# Patient Record
Sex: Female | Born: 1945 | Race: White | Hispanic: No | Smoking: Never smoker
Health system: Southern US, Community
[De-identification: ages and names within clinical notes are randomized; demographics above are authoritative.]

## PROBLEM LIST (undated history)

## (undated) DIAGNOSIS — I10 Essential (primary) hypertension: Secondary | ICD-10-CM

## (undated) HISTORY — PX: CHOLECYSTECTOMY: SHX55

---

## 2019-06-02 ENCOUNTER — Other Ambulatory Visit: Payer: Self-pay

## 2019-06-02 DIAGNOSIS — Z20822 Contact with and (suspected) exposure to covid-19: Secondary | ICD-10-CM

## 2019-06-03 ENCOUNTER — Telehealth: Payer: Self-pay | Admitting: *Deleted

## 2019-06-03 NOTE — Telephone Encounter (Signed)
Son (pt with him) called looking for covid-19 test result.  No result available.

## 2019-06-04 LAB — NOVEL CORONAVIRUS, NAA: SARS-CoV-2, NAA: NOT DETECTED

## 2019-06-07 ENCOUNTER — Inpatient Hospital Stay
Admission: EM | Admit: 2019-06-07 | Discharge: 2019-06-10 | DRG: 515 | Disposition: A | Discharge status: Home / Self Care | Payer: Medicare (Managed Care) | Attending: Internal Medicine | Admitting: Internal Medicine

## 2019-06-07 ENCOUNTER — Observation Stay: Payer: Medicare (Managed Care)

## 2019-06-07 ENCOUNTER — Encounter: Payer: Self-pay | Admitting: Emergency Medicine

## 2019-06-07 ENCOUNTER — Emergency Department: Payer: Medicare (Managed Care)

## 2019-06-07 ENCOUNTER — Other Ambulatory Visit: Payer: Self-pay

## 2019-06-07 DIAGNOSIS — M810 Age-related osteoporosis without current pathological fracture: Secondary | ICD-10-CM | POA: Diagnosis present

## 2019-06-07 DIAGNOSIS — F419 Anxiety disorder, unspecified: Secondary | ICD-10-CM | POA: Diagnosis present

## 2019-06-07 DIAGNOSIS — S22080A Wedge compression fracture of T11-T12 vertebra, initial encounter for closed fracture: Principal | ICD-10-CM | POA: Diagnosis present

## 2019-06-07 DIAGNOSIS — U071 COVID-19: Secondary | ICD-10-CM | POA: Diagnosis present

## 2019-06-07 DIAGNOSIS — T48205A Adverse effect of unspecified drugs acting on muscles, initial encounter: Secondary | ICD-10-CM | POA: Diagnosis present

## 2019-06-07 DIAGNOSIS — R55 Syncope and collapse: Secondary | ICD-10-CM

## 2019-06-07 DIAGNOSIS — Z7983 Long term (current) use of bisphosphonates: Secondary | ICD-10-CM

## 2019-06-07 DIAGNOSIS — Z419 Encounter for procedure for purposes other than remedying health state, unspecified: Secondary | ICD-10-CM

## 2019-06-07 DIAGNOSIS — M549 Dorsalgia, unspecified: Secondary | ICD-10-CM | POA: Diagnosis not present

## 2019-06-07 DIAGNOSIS — W19XXXA Unspecified fall, initial encounter: Secondary | ICD-10-CM | POA: Diagnosis present

## 2019-06-07 DIAGNOSIS — I10 Essential (primary) hypertension: Secondary | ICD-10-CM | POA: Diagnosis present

## 2019-06-07 DIAGNOSIS — I951 Orthostatic hypotension: Secondary | ICD-10-CM | POA: Diagnosis present

## 2019-06-07 DIAGNOSIS — T465X5A Adverse effect of other antihypertensive drugs, initial encounter: Secondary | ICD-10-CM | POA: Diagnosis present

## 2019-06-07 HISTORY — DX: Essential (primary) hypertension: I10

## 2019-06-07 LAB — TROPONIN I (HIGH SENSITIVITY)
Troponin I (High Sensitivity): 4 ng/L (ref <18)
Troponin I (High Sensitivity): 4 ng/L (ref <18)
Troponin I (High Sensitivity): 5 ng/L (ref <18)

## 2019-06-07 LAB — CBC WITH DIFFERENTIAL/PLATELET
Abs Immature Granulocytes: 0.03 10*3/uL (ref 0.00–0.07)
Basophils Absolute: 0 10*3/uL (ref 0.0–0.1)
Basophils Relative: 0 %
Eosinophils Absolute: 0 10*3/uL (ref 0.0–0.5)
Eosinophils Relative: 0 %
HCT: 41.5 % (ref 36.0–46.0)
Hemoglobin: 13.8 g/dL (ref 12.0–15.0)
Immature Granulocytes: 1 %
Lymphocytes Relative: 26 %
Lymphs Abs: 1 10*3/uL (ref 0.7–4.0)
MCH: 29.1 pg (ref 26.0–34.0)
MCHC: 33.3 g/dL (ref 30.0–36.0)
MCV: 87.4 fL (ref 80.0–100.0)
Monocytes Absolute: 0.3 10*3/uL (ref 0.1–1.0)
Monocytes Relative: 8 %
Neutro Abs: 2.5 10*3/uL (ref 1.7–7.7)
Neutrophils Relative %: 65 %
Platelets: 237 10*3/uL (ref 150–400)
RBC: 4.75 MIL/uL (ref 3.87–5.11)
RDW: 12.3 % (ref 11.5–15.5)
WBC: 3.9 10*3/uL — ABNORMAL LOW (ref 4.0–10.5)
nRBC: 0 % (ref 0.0–0.2)

## 2019-06-07 LAB — COMPREHENSIVE METABOLIC PANEL
ALT: 18 U/L (ref 0–44)
AST: 22 U/L (ref 15–41)
Albumin: 4.2 g/dL (ref 3.5–5.0)
Alkaline Phosphatase: 54 U/L (ref 38–126)
Anion gap: 12 (ref 5–15)
BUN: 10 mg/dL (ref 8–23)
CO2: 25 mmol/L (ref 22–32)
Calcium: 9.3 mg/dL (ref 8.9–10.3)
Chloride: 103 mmol/L (ref 98–111)
Creatinine, Ser: 0.63 mg/dL (ref 0.44–1.00)
GFR calc Af Amer: >60 mL/min (ref >60)
GFR calc non Af Amer: >60 mL/min (ref >60)
Glucose, Bld: 158 mg/dL — ABNORMAL HIGH (ref 70–99)
Potassium: 3.9 mmol/L (ref 3.5–5.1)
Sodium: 140 mmol/L (ref 135–145)
Total Bilirubin: 1 mg/dL (ref 0.3–1.2)
Total Protein: 6.9 g/dL (ref 6.5–8.1)

## 2019-06-07 LAB — GLUCOSE, CAPILLARY: Glucose-Capillary: 134 mg/dL — ABNORMAL HIGH (ref 70–99)

## 2019-06-07 LAB — SARS CORONAVIRUS 2 (TAT 6-24 HRS): SARS Coronavirus 2: POSITIVE — AB

## 2019-06-07 MED ORDER — PRAVASTATIN SODIUM 20 MG PO TABS
20.0000 mg | ORAL_TABLET | Freq: Every day | ORAL | Status: DC
  Administered 2019-06-07 – 2019-06-10 (×3): 20 mg via ORAL
  Filled 2019-06-07 (×3): qty 1

## 2019-06-07 MED ORDER — GABAPENTIN 300 MG PO CAPS
300.0000 mg | ORAL_CAPSULE | Freq: Every day | ORAL | Status: DC
  Administered 2019-06-10: 300 mg via ORAL
  Filled 2019-06-07 (×3): qty 1

## 2019-06-07 MED ORDER — MORPHINE SULFATE (PF) 2 MG/ML IV SOLN
2.0000 mg | Freq: Once | INTRAVENOUS | Status: AC
  Administered 2019-06-07: 14:00:00 2 mg via INTRAVENOUS
  Filled 2019-06-07: qty 1

## 2019-06-07 MED ORDER — SODIUM CHLORIDE 0.9 % IV SOLN
INTRAVENOUS | Status: AC
  Administered 2019-06-07 – 2019-06-08 (×3): via INTRAVENOUS

## 2019-06-07 MED ORDER — ONDANSETRON HCL 4 MG/2ML IJ SOLN: 4.0000 mg | Freq: Four times a day (QID) | INTRAMUSCULAR | Status: DC | PRN

## 2019-06-07 MED ORDER — TRAMADOL HCL 50 MG PO TABS
50.0000 mg | ORAL_TABLET | Freq: Four times a day (QID) | ORAL | Status: DC | PRN
  Administered 2019-06-08: 50 mg via ORAL
  Filled 2019-06-07: qty 1

## 2019-06-07 MED ORDER — ONDANSETRON HCL 4 MG/2ML IJ SOLN
4.0000 mg | Freq: Once | INTRAMUSCULAR | Status: AC
  Administered 2019-06-07: 09:00:00 4 mg via INTRAVENOUS
  Filled 2019-06-07: qty 2

## 2019-06-07 MED ORDER — ENOXAPARIN SODIUM 40 MG/0.4ML ~~LOC~~ SOLN
40.0000 mg | SUBCUTANEOUS | Status: DC
  Administered 2019-06-07: 40 mg via SUBCUTANEOUS
  Filled 2019-06-07: qty 0.4

## 2019-06-07 MED ORDER — ALENDRONATE SODIUM 70 MG PO TABS: 70.0000 mg | ORAL_TABLET | ORAL | Status: DC

## 2019-06-07 MED ORDER — ONDANSETRON HCL 4 MG/2ML IJ SOLN
4.0000 mg | Freq: Once | INTRAMUSCULAR | Status: AC
  Administered 2019-06-07: 14:00:00 4 mg via INTRAVENOUS
  Filled 2019-06-07: qty 2

## 2019-06-07 MED ORDER — CLONAZEPAM 0.5 MG PO TABS
0.5000 mg | ORAL_TABLET | Freq: Every day | ORAL | Status: DC | PRN
  Administered 2019-06-07 – 2019-06-08 (×2): 0.5 mg via ORAL
  Filled 2019-06-07 (×2): qty 1

## 2019-06-07 MED ORDER — SODIUM CHLORIDE 0.9 % IV BOLUS
1000.0000 mL | Freq: Once | INTRAVENOUS | Status: AC
  Administered 2019-06-07: 1000 mL via INTRAVENOUS

## 2019-06-07 MED ORDER — ONDANSETRON HCL 4 MG PO TABS: 4.0000 mg | ORAL_TABLET | Freq: Four times a day (QID) | ORAL | Status: DC | PRN

## 2019-06-07 MED ORDER — ACETAMINOPHEN 325 MG PO TABS
650.0000 mg | ORAL_TABLET | Freq: Four times a day (QID) | ORAL | Status: DC | PRN
  Administered 2019-06-08 (×2): 650 mg via ORAL
  Filled 2019-06-07 (×2): qty 2

## 2019-06-07 MED ORDER — ACETAMINOPHEN 650 MG RE SUPP
650.0000 mg | Freq: Four times a day (QID) | RECTAL | Status: DC | PRN
  Filled 2019-06-07: qty 1

## 2019-06-07 NOTE — ED Provider Notes (Addendum)
[facility] Emergency Department Provider Note   ____________________________________________   First MD Initiated Contact with Patient 06/07/19 [phone]     (approximate)  I have reviewed the triage vital signs and the nursing notes.   HISTORY  Chief Complaint Fall   HPI Danley DankerGeorgina Labella is a 73 y.o. female who is helping her husband who is positive for Covid.  Patient is tested negative.  She was helping him and then got hot and sweaty her vision started to go out and she saw stars.  She sat down briefly and then got up to get some Tylenol flu and passed out fell backwards and hit her head on the wall.  Daughter reports she was unconscious briefly when she was found.  Patient complains now only of some pain in the lower ribs bilaterally.        Past Medical History:  Diagnosis Date   Hypertension     There are no active problems to display for this patient.   Past Surgical History:  Procedure Laterality Date   CHOLECYSTECTOMY      Prior to Admission medications   Not on File    Allergies Patient has no known allergies.  History reviewed. No pertinent family history.  Social History Social History   Tobacco Use   Smoking status: Never Smoker   Smokeless tobacco: Never Used  Substance Use Topics   Alcohol use: Never    Frequency: Never   Drug use: Never    Review of Systems  Constitutional: No fever/chills Eyes: No visual changes. ENT: No sore throat. Cardiovascular: Denies chest pain. Respiratory: Denies shortness of breath. Gastrointestinal: No abdominal pain.  No nausea, no vomiting.  No diarrhea.  No constipation. Genitourinary: Negative for dysuria. Musculoskeletal: Negative for back pain. Skin: Negative for rash. Neurological: Negative for headaches, focal weakness    ____________________________________________   PHYSICAL EXAM:  VITAL SIGNS: ED Triage Vitals  Enc Vitals Group     BP 06/07/19 0633 (!)  151/54     Pulse Rate 06/07/19 0633 74     Resp 06/07/19 0633 12     Temp 06/07/19 0633 98.5 F (36.9 C)     Temp Source 06/07/19 0633 Oral     SpO2 06/07/19 0633 97 %     Weight 06/07/19 0634 149 lb (67.6 kg)     Height 06/07/19 0634 5\' 2"  (1.575 m)     Head Circumference --      Peak Flow --      Pain Score 06/07/19 0634 8     Pain Loc --      Pain Edu? --      Excl. in GC? --     Constitutional: Alert and oriented. Well appearing and in no acute distress. Eyes: Conjunctivae are normal.  Head: Atraumatic. Nose: No congestion/rhinnorhea. Mouth/Throat: Mucous membranes are moist.  Oropharynx non-erythematous. Neck: No stridor.Cardiovascular: Normal rate, regular rhythm. Grossly normal heart sounds.  Good peripheral circulation. Respiratory: Normal respiratory effort.  No retractions. Lungs CTAB. Gastrointestinal: Soft and nontender. No distention. No abdominal bruits. No CVA tenderness. Musculoskeletal: No lower extremity tenderness nor edema.  Patient denied any pain on palpation of her back initially later complains of back pain. Neurologic:  Normal speech and language. No gross focal neurologic deficits are appreciated.  Motor strength in arms and legs is equal and symmetrical bilaterally patient with no numbness reported in your extremities or elsewhere. Skin:  Skin is warm, dry and intact. No rash noted.  ____________________________________________   LABS (all labs ordered are listed, but only abnormal results are displayed)  Labs Reviewed  COMPREHENSIVE METABOLIC PANEL - Abnormal; Notable for the following components:      Result Value   Glucose, Bld 158 (*)    All other components within normal limits  CBC WITH DIFFERENTIAL/PLATELET - Abnormal; Notable for the following components:   WBC 3.9 (*)    All other components within normal limits  SARS CORONAVIRUS 2 (TAT 6-24 HRS)  URINALYSIS, COMPLETE (UACMP) WITH MICROSCOPIC  TROPONIN I (HIGH SENSITIVITY)  TROPONIN I  (HIGH SENSITIVITY)   ____________________________________________  EKG EKG read interpreted by me shows normal sinus rhythm rate of 83 left axis flipped T's in V1 through V3 no other findings.  Computer is reading borderline prolonged QT QTC is measured at 493 ms there are no old EKGs __EKG #2 read and interpreted by me is essentially unchanged normal sinus rhythm at 79 left axis again no changes from #1 __________________________________________  RADIOLOGY  ED MD interpretation: X-rays read by radiology reviewed by me show no acute cardiopulmonary disease specifically no rib fractures lumbar films show compression fracture of T11.  Patient very tender there at all but also no history of lumbar compression fractures.  She is having some nausea and discomfort around the bottom edges of the ribs that is not hurting when I push on them.  Official radiology report(s): Dg Chest 1 View  Result Date: 06/07/2019 CLINICAL DATA:  Post fall. Patient's husband tested positive for COVID-19. EXAM: CHEST  1 VIEW COMPARISON:  None. FINDINGS: Normal cardiac silhouette and mediastinal contours scratch the borderline enlarged cardiac silhouette and mediastinal contours. Atherosclerotic plaque when the thoracic aorta with mild rightward deviation of the tracheal air, the level of the aortic arch. No focal airspace opacities. No pleural effusion or pneumothorax. There is mild elevation/eventration of the right hemidiaphragm. No evidence of edema. Post cholecystectomy. No acute osseous abnormalities. IMPRESSION: No acute cardiopulmonary disease. Electronically Signed   By: Simonne Come M.D.   On: 06/07/2019 07:41   Dg Lumbar Spine Complete  Result Date: 06/07/2019 CLINICAL DATA:  Post fall. EXAM: LUMBAR SPINE - COMPLETE 4+ VIEW COMPARISON:  None. FINDINGS: There are 5 non rib-bearing lumbar type vertebral bodies. Normal alignment of the lumbar spine. No anterolisthesis or retrolisthesis. Age-indeterminate severe  (approximately 75%) compression deformity involving the T11 vertebral body. Remaining lumbar vertebral body heights appear preserved. Lumbar intervertebral disc space heights appear preserved. Limited visualization the bilateral SI joints is normal. Atherosclerotic plaque within the abdominal aorta. Post cholecystectomy. Regional bowel gas pattern and soft tissues appear normal. IMPRESSION: 1. Age-indeterminate severe (approximately 75%) compression deformity involving the T11 vertebral body. Correlation for point tenderness at this location is advised. 2.  Aortic Atherosclerosis (ICD10-I70.0). Electronically Signed   By: Simonne Come M.D.   On: 06/07/2019 07:40   Ct Head Wo Contrast  Result Date: 06/07/2019 CLINICAL DATA:  Fall EXAM: CT HEAD WITHOUT CONTRAST CT CERVICAL SPINE WITHOUT CONTRAST TECHNIQUE: Multidetector CT imaging of the head and cervical spine was performed following the standard protocol without intravenous contrast. Multiplanar CT image reconstructions of the cervical spine were also generated. COMPARISON:  None. FINDINGS: CT HEAD FINDINGS Brain: No evidence of acute infarction, hemorrhage, hydrocephalus, extra-axial collection or mass lesion/mass effect. Mild subcortical white matter and periventricular small vessel ischemic changes. Vascular: Intracranial atherosclerosis. Skull: Normal. Negative for fracture or focal lesion. Sinuses/Orbits: The visualized paranasal sinuses are essentially clear. The mastoid air cells are unopacified. Other: None. CT  CERVICAL SPINE FINDINGS Alignment: Normal cervical lordosis. Skull base and vertebrae: No acute fracture. No primary bone lesion or focal pathologic process. Soft tissues and spinal canal: No prevertebral fluid or swelling. No visible canal hematoma. Disc levels: Vertebral body heights and intervertebral disc spaces are maintained. Mild degenerative changes at C5-6. Spinal canal is patent. Upper chest: Visualized lung apices are clear. Other:  Visualized thyroid is unremarkable. IMPRESSION: No evidence of acute intracranial abnormality. Mild small vessel ischemic changes. No evidence of traumatic injury to the cervical spine. Mild degenerative changes. Electronically Signed   By: Charline Bills M.D.   On: 06/07/2019 08:03   Ct Cervical Spine Wo Contrast  Result Date: 06/07/2019 CLINICAL DATA:  Fall EXAM: CT HEAD WITHOUT CONTRAST CT CERVICAL SPINE WITHOUT CONTRAST TECHNIQUE: Multidetector CT imaging of the head and cervical spine was performed following the standard protocol without intravenous contrast. Multiplanar CT image reconstructions of the cervical spine were also generated. COMPARISON:  None. FINDINGS: CT HEAD FINDINGS Brain: No evidence of acute infarction, hemorrhage, hydrocephalus, extra-axial collection or mass lesion/mass effect. Mild subcortical white matter and periventricular small vessel ischemic changes. Vascular: Intracranial atherosclerosis. Skull: Normal. Negative for fracture or focal lesion. Sinuses/Orbits: The visualized paranasal sinuses are essentially clear. The mastoid air cells are unopacified. Other: None. CT CERVICAL SPINE FINDINGS Alignment: Normal cervical lordosis. Skull base and vertebrae: No acute fracture. No primary bone lesion or focal pathologic process. Soft tissues and spinal canal: No prevertebral fluid or swelling. No visible canal hematoma. Disc levels: Vertebral body heights and intervertebral disc spaces are maintained. Mild degenerative changes at C5-6. Spinal canal is patent. Upper chest: Visualized lung apices are clear. Other: Visualized thyroid is unremarkable. IMPRESSION: No evidence of acute intracranial abnormality. Mild small vessel ischemic changes. No evidence of traumatic injury to the cervical spine. Mild degenerative changes. Electronically Signed   By: Charline Bills M.D.   On: 06/07/2019 08:03    ____________________________________________   PROCEDURES  Procedure(s)  performed (including Critical Care):  Procedures   ____________________________________________   INITIAL IMPRESSION / ASSESSMENT AND PLAN / ED COURSE   Patient with syncope after just walking down the hallway with her husband to the bathroom initially had some low back pain but does not have any now.  Now she is a little nauseated and says she has been nauseated since the syncopal episode with some vague discomfort at the bottom of the ribs and/or any epigastric area of the belly.  There is no pain on palpation though.    Since she is 73 years old and had unprovoked syncope I think we will get her in the hospital and monitor her.  The rest of her family will help watch the husband.   Azayla Polo was evaluated in Emergency Department on 06/07/2019 for the symptoms described in the history of present illness. She was evaluated in the context of the global COVID-19 pandemic, which necessitated consideration that the patient might be at risk for infection with the SARS-CoV-2 virus that causes COVID-19. Institutional protocols and algorithms that pertain to the evaluation of patients at risk for COVID-19 are in a state of rapid change based on information released by regulatory bodies including the CDC and federal and state organizations. These policies and algorithms were followed during the patient's care in the ED.      ____________________________________________   FINAL CLINICAL IMPRESSION(S) / ED DIAGNOSES  Final diagnoses:  Fall, initial encounter  Syncope, unspecified syncope type     ED Discharge Orders  None       Note:  This document was prepared using Dragon voice recognition software and may include unintentional dictation errors.    Nena Polio, MD 06/07/19 [id number]    Nena Polio, MD 06/07/19 [phone]

## 2019-06-07 NOTE — ED Notes (Signed)
Sat patient up to eat and she started c/o feeling dizzy and hot, pt appeared pale and sweaty, BP 76/44, laid patient back down flat and BP went back up to 117/53. Glucose 134, Dr. Cinda Quest informed, see new orders. Placed purewick for patient to urinate

## 2019-06-07 NOTE — ED Notes (Addendum)
Pt reports taking her prescribed lisinopril PRN for a HA last night at 2300, pt denies taking BP, pt reports feeling very tired and continuous back pain from lifting and moving her husband, pt counseled on ad hoc use of lisinopril - recommended tylenol for HA  Pt reports LOC but no pain to head

## 2019-06-07 NOTE — ED Notes (Signed)
Ok for pt to eat per Dr. Cinda Quest, given sandwich tray and apple juice, daughter gicen shasta and crackers. Pt reports back pain 8/10, Dr. Cinda Quest informed, see new orders

## 2019-06-07 NOTE — H&P (Signed)
[facility] [place] at [facility]   PATIENT NAME: Victoria Haley    MR#:  [id number]  DATE OF BIRTH:  [birth year]  DATE OF ADMISSION:  06/07/2019  PRIMARY CARE PHYSICIAN: System, Pcp Not In   REQUESTING/REFERRING PHYSICIAN: Juliette Alcide  CHIEF COMPLAINT:  Syncope  HISTORY OF PRESENT ILLNESS:  Victoria Haley  is a 73 y.o. female with a known history of hypertension but blood pressure is usually low, anxiety is brought into the ED after she sustained a syncopal episode.  Patient was feeling nauseous.  CT head is negative CT CT cervical spine is negative.  Patient was complaining of back pain and lumbar x-ray has revealed a T11 compression fracture with 60% height loss age indeterminate.  Patient's husband was positive for Covid and while she was helping her husband to toilet she felt sweaty and lightheaded.  This happened after she took Tylenol flu and passed out fell backwards.  Daughter-in-law brought her into the hospital.  Patient is orthostatic.  Otherwise denies any chest pain or shortness of breath  PAST MEDICAL HISTORY:   Past Medical History:  Diagnosis Date  . Hypertension    Anxiety, osteoporosis PAST SURGICAL HISTOIRY:   Past Surgical History:  Procedure Laterality Date  . CHOLECYSTECTOMY      SOCIAL HISTORY:   Social History   Tobacco Use  . Smoking status: Never Smoker  . Smokeless tobacco: Never Used  Substance Use Topics  . Alcohol use: Never    Frequency: Never    FAMILY HISTORY:  History reviewed. No pertinent family history.  DRUG ALLERGIES:  No Known Allergies  REVIEW OF SYSTEMS:  CONSTITUTIONAL: No fever, reporting generalized weakness  EYES: No blurred or double vision.  EARS, NOSE, AND THROAT: No tinnitus or ear pain.  RESPIRATORY: No cough, shortness of breath, wheezing or hemoptysis.  CARDIOVASCULAR: No chest pain, orthopnea, edema.  GASTROINTESTINAL: No nausea, vomiting, diarrhea or abdominal pain.   GENITOURINARY: No dysuria, hematuria.  ENDOCRINE: No polyuria, nocturia,  HEMATOLOGY: No anemia, easy bruising or bleeding SKIN: No rash or lesion. MUSCULOSKELETAL: Has chronic low back pain no joint pain or arthritis.   NEUROLOGIC: No tingling, numbness, weakness.  PSYCHIATRY: No anxiety or depression.   MEDICATIONS AT HOME:   Prior to Admission medications   Medication Sig Start Date End Date Taking? Authorizing Provider  clonazePAM (KLONOPIN) 0.5 MG tablet Take 0.5 mg by mouth daily as needed.  03/10/19  Yes [provider]  lisinopril (ZESTRIL) 5 MG tablet Take 5 mg by mouth daily. 03/28/19  Yes [provider]  lovastatin (MEVACOR) 20 MG tablet Take 20 mg by mouth daily. 04/04/19  Yes [provider]  naproxen (NAPROSYN) 500 MG tablet Take 500 mg by mouth 2 (two) times daily as needed.  05/12/19  Yes [provider]  tiZANidine (ZANAFLEX) 2 MG tablet Take by mouth every 6 (six) hours as needed for muscle spasms.   Yes [provider]      VITAL SIGNS:  Blood pressure (!) 132/53, pulse 73, temperature 98.5 F (36.9 C), temperature source Oral, resp. rate 15, height  (1.575 m), weight 67.6 kg, SpO2 92 %.  PHYSICAL EXAMINATION:  GENERAL:  73 y.o.-year-old patient lying in the bed with no acute distress.  EYES: Pupils equal, round, reactive to light and accommodation. No scleral icterus. Extraocular muscles intact.  HEENT: Head atraumatic, normocephalic. Oropharynx and nasopharynx clear.  NECK:  Supple, no jugular venous distention. No thyroid enlargement, no tenderness.  LUNGS: Normal  breath sounds bilaterally, no wheezing, rales,rhonchi or crepitation. No use of accessory muscles of respiration.  CARDIOVASCULAR: S1, S2 normal. No murmurs, rubs, or gallops.  ABDOMEN: Soft, nontender, nondistended. Bowel sounds present.  EXTREMITIES: No pedal edema, cyanosis, or clubbing.  NEUROLOGIC: Cranial nerves II through XII are intact. Muscle  strength 5/5 in all extremities. Sensation intact. Gait not checked.  PSYCHIATRIC: The patient is alert and oriented x 3.  SKIN: No obvious rash, lesion, or ulcer.   LABORATORY PANEL:   CBC Recent Labs  Lab 06/07/19 0624  WBC 3.9*  HGB 13.8  HCT 41.5  PLT 237   ------------------------------------------------------------------------------------------------------------------  Chemistries  Recent Labs  Lab 06/07/19 0624  NA 140  K 3.9  CL 103  CO2 25  GLUCOSE 158*  BUN 10  CREATININE 0.63  CALCIUM 9.3  AST 22  ALT 18  ALKPHOS 54  BILITOT 1.0   ------------------------------------------------------------------------------------------------------------------  Cardiac Enzymes No results for input(s): TROPONINI in the last 168 hours. ------------------------------------------------------------------------------------------------------------------  RADIOLOGY:  Dg Chest 1 View  Result Date: 06/07/2019 CLINICAL DATA:  Post fall. Patient's husband tested positive for COVID-19. EXAM: CHEST  1 VIEW COMPARISON:  None. FINDINGS: Normal cardiac silhouette and mediastinal contours scratch the borderline enlarged cardiac silhouette and mediastinal contours. Atherosclerotic plaque when the thoracic aorta with mild rightward deviation of the tracheal air, the level of the aortic arch. No focal airspace opacities. No pleural effusion or pneumothorax. There is mild elevation/eventration of the right hemidiaphragm. No evidence of edema. Post cholecystectomy. No acute osseous abnormalities. IMPRESSION: No acute cardiopulmonary disease. Electronically Signed   By: Sandi Mariscal M.D.   On: 06/07/2019 07:41   Dg Lumbar Spine Complete  Result Date: 06/07/2019 CLINICAL DATA:  Post fall. EXAM: LUMBAR SPINE - COMPLETE 4+ VIEW COMPARISON:  None. FINDINGS: There are 5 non rib-bearing lumbar type vertebral bodies. Normal alignment of the lumbar spine. No anterolisthesis or retrolisthesis.  Age-indeterminate severe (approximately 75%) compression deformity involving the T11 vertebral body. Remaining lumbar vertebral body heights appear preserved. Lumbar intervertebral disc space heights appear preserved. Limited visualization the bilateral SI joints is normal. Atherosclerotic plaque within the abdominal aorta. Post cholecystectomy. Regional bowel gas pattern and soft tissues appear normal. IMPRESSION: 1. Age-indeterminate severe (approximately 75%) compression deformity involving the T11 vertebral body. Correlation for point tenderness at this location is advised. 2.  Aortic Atherosclerosis (ICD10-I70.0). Electronically Signed   By: Sandi Mariscal M.D.   On: 06/07/2019 07:40   Ct Head Wo Contrast  Result Date: 06/07/2019 CLINICAL DATA:  Fall EXAM: CT HEAD WITHOUT CONTRAST CT CERVICAL SPINE WITHOUT CONTRAST TECHNIQUE: Multidetector CT imaging of the head and cervical spine was performed following the standard protocol without intravenous contrast. Multiplanar CT image reconstructions of the cervical spine were also generated. COMPARISON:  None. FINDINGS: CT HEAD FINDINGS Brain: No evidence of acute infarction, hemorrhage, hydrocephalus, extra-axial collection or mass lesion/mass effect. Mild subcortical white matter and periventricular small vessel ischemic changes. Vascular: Intracranial atherosclerosis. Skull: Normal. Negative for fracture or focal lesion. Sinuses/Orbits: The visualized paranasal sinuses are essentially clear. The mastoid air cells are unopacified. Other: None. CT CERVICAL SPINE FINDINGS Alignment: Normal cervical lordosis. Skull base and vertebrae: No acute fracture. No primary bone lesion or focal pathologic process. Soft tissues and spinal canal: No prevertebral fluid or swelling. No visible canal hematoma. Disc levels: Vertebral body heights and intervertebral disc spaces are maintained. Mild degenerative changes at C5-6. Spinal canal is patent. Upper chest: Visualized lung  apices are clear. Other: Visualized thyroid is  unremarkable. IMPRESSION: No evidence of acute intracranial abnormality. Mild small vessel ischemic changes. No evidence of traumatic injury to the cervical spine. Mild degenerative changes. Electronically Signed   By: Charline Bills M.D.   On: 06/07/2019 08:03   Ct Cervical Spine Wo Contrast  Result Date: 06/07/2019 CLINICAL DATA:  Fall EXAM: CT HEAD WITHOUT CONTRAST CT CERVICAL SPINE WITHOUT CONTRAST TECHNIQUE: Multidetector CT imaging of the head and cervical spine was performed following the standard protocol without intravenous contrast. Multiplanar CT image reconstructions of the cervical spine were also generated. COMPARISON:  None. FINDINGS: CT HEAD FINDINGS Brain: No evidence of acute infarction, hemorrhage, hydrocephalus, extra-axial collection or mass lesion/mass effect. Mild subcortical white matter and periventricular small vessel ischemic changes. Vascular: Intracranial atherosclerosis. Skull: Normal. Negative for fracture or focal lesion. Sinuses/Orbits: The visualized paranasal sinuses are essentially clear. The mastoid air cells are unopacified. Other: None. CT CERVICAL SPINE FINDINGS Alignment: Normal cervical lordosis. Skull base and vertebrae: No acute fracture. No primary bone lesion or focal pathologic process. Soft tissues and spinal canal: No prevertebral fluid or swelling. No visible canal hematoma. Disc levels: Vertebral body heights and intervertebral disc spaces are maintained. Mild degenerative changes at C5-6. Spinal canal is patent. Upper chest: Visualized lung apices are clear. Other: Visualized thyroid is unremarkable. IMPRESSION: No evidence of acute intracranial abnormality. Mild small vessel ischemic changes. No evidence of traumatic injury to the cervical spine. Mild degenerative changes. Electronically Signed   By: Charline Bills M.D.   On: 06/07/2019 08:03   Ct Thoracic Spine Wo Contrast  Result Date:  06/07/2019 CLINICAL DATA:  T11 compression fracture seen by radiography. EXAM: CT THORACIC SPINE WITHOUT CONTRAST TECHNIQUE: Multidetector CT images of the thoracic were obtained using the standard protocol without intravenous contrast. COMPARISON:  Chest CT same day FINDINGS: Alignment: Slightly increased kyphotic curvature at the lower thoracic region because of the T11 fracture. Vertebrae: Superior endplate fracture at T11 with loss of height anteriorly of 60%. Posterior bowing of the posterosuperior margin of the vertebral body by 4 mm. No posterior element fracture. No other vertebral level fracture. Paraspinal and other soft tissues: Mild surrounding edema. Disc levels: No significant primary disc pathology suspected in the thoracic or upper lumbar region. The canal and foramina are widely patent at all levels with exception of T11. As noted above, at T11 there is posterior bowing of the posterosuperior margin of the vertebral body by 4 mm. This encroaches mildly upon the ventral aspect of the spinal canal but does not likely cause compressive narrowing. IMPRESSION: 1. Superior endplate compression fracture at T11 with loss of height anteriorly of 60%. Posterior bowing of the posterosuperior margin of the vertebral body by 4 mm. No posterior element fracture. This looks like a benign fracture. 2. Slightly increased kyphotic curvature at the lower thoracic region because of the T11 fracture. Electronically Signed   By: Paulina Fusi M.D.   On: 06/07/2019 09:24    EKG:   Orders placed or performed during the hospital encounter of 06/07/19  . EKG 12-Lead  . EKG 12-Lead  . ED EKG  . ED EKG  . Repeat EKG  . Repeat EKG    IMPRESSION AND PLAN:    #Syncope-orthostatic hypotension as well as muscle relaxants adverse effect Admit to telemetry Gentle hydration with IV fluids Orthostatics are positive Monitor patient on telemetry COVID-19 pending CT head and C-spine are negative Stop muscle  relaxants  #Low back pain acute-T11 compression fracture with 60% height loss age indeterminate Will  consult orthopedics for kyphoplasty Pain management as needed PT assessment  #Hypotension chronic Holding blood pressure medications Hydrate with IV fluids  #Chronic history of anxiety continue Klonopin home medication  #History of osteoporosis continue Fosamax 70 mg once a week  DVT prophylaxis with Lovenox subcu  All the records are reviewed and case discussed with ED provider. Management plans discussed with the patient, family and they are in agreement.  CODE STATUS: fc, Jehovah witness  TOTAL TIME TAKING CARE OF THIS PATIENT: 45 minutes.   Note: This dictation was prepared with Dragon dictation along with smaller phrase technology. Any transcriptional errors that result from this process are unintentional.  Ramonita LabAruna Charnika Herbst M.D on 06/07/2019 at 3:34 PM  Between 7am to 6pm - Pager - [phone]  After 6pm go to [email or web address] - password [email or web address]  Fabio Neighborsagle [facility]  Office  [phone]  CC: Primary care physician; System, Pcp Not In

## 2019-06-07 NOTE — Progress Notes (Signed)
Family Meeting Note  Advance Directive:yes  Today a meeting took place with the Patient and daughter-in-law at bedside    The following clinical team members were present during this meeting:MD  The following were discussed:Patient's diagnosis: Syncope, acute abdominal pain, nausea, history of hypotension is admitted to the hospital.  Plan of care discussed in detail with the patient and her daughter-in-law at bedside.  CT head is negative   patient's progosis: Unable to determine and Goals for treatment: Full Code  2 sons are healthcare power of attorney and patient is Jehovah witness  Additional follow-up to be provided: Hospitalist  Time spent during discussion:27min  Nicholes Mango, MD

## 2019-06-07 NOTE — Progress Notes (Signed)
PT Cancellation Note  Patient Details Name: Victoria Haley MRN: [id number] DOB: [birth year]   Cancelled Treatment:    Reason Eval/Treat Not Completed: Patient not medically ready.  Order received and chart reviewed.  Per nursing, pt has recently become hypotensive and very ill when sitting up to try to eat.  Will hold until morning when pt is more appropriate.  Roxanne Gates, PT, DPT  Roxanne Gates 06/07/2019, 3:31 PM

## 2019-06-07 NOTE — Care Management Obs Status (Signed)
[facility] NOTIFICATION   Patient Details  Name: Victoria Haley MRN: [id number] Date of Birth: Dec 14, 1945   Medicare Observation Status Notification Given:  Yes    Marshell Garfinkel, RN 06/07/2019, 1:48 PM

## 2019-06-07 NOTE — ED Notes (Addendum)
This RN provided care handoff to RN Sheryle Spray; however room assigned 248 is "not ready" for patient. RN Maricer will call back shortly.

## 2019-06-07 NOTE — TOC Initial Note (Signed)
Transition of Care [facility]) - Initial/Assessment Note    Patient Details  Name: Victoria Haley MRN: [id number] Date of Birth: December 02, 1945  [facility]) CM/SW Contact:    Marshell Garfinkel, RN Phone Number: 06/07/2019, 2:08 PM  Clinical Narrative:                 Met with patient and her daughter in law who translated MOON letter as I explained. She speak Spanish. Patient is independent from home. Patient's husband is currently being cared for by his son in her absence. TOC to follow however they don't expect any needs.    Expected Discharge Plan: Home/Self Care     Patient Goals and CMS Choice     Choice offered to / list presented to : Adult Children  Expected Discharge Plan and Services Expected Discharge Plan: Home/Self Care                                              Prior Living Arrangements/Services                       Activities of Daily Living      Permission Sought/Granted                  Emotional Assessment              Admission diagnosis:  Fall Patient Active Problem List   Diagnosis Date Noted  . Syncope 06/07/2019   PCP:  System, Pcp Not In Pharmacy:   [id number], [place] [address] Phone: [phone] Fax: [phone]     Social Determinants of Health (SDOH) Interventions    Readmission Risk Interventions No flowsheet data found.

## 2019-06-07 NOTE — ED Triage Notes (Signed)
Patient presents to Emergency Department via [facility] from home with complaints of fall while helping husband to toilet.  Husband positive for COVID, pt has tested negative.  Pt now feels weak and is sweaty.    History of fatty liver, gall bladder removal

## 2019-06-08 DIAGNOSIS — T48205A Adverse effect of unspecified drugs acting on muscles, initial encounter: Secondary | ICD-10-CM | POA: Diagnosis present

## 2019-06-08 DIAGNOSIS — M549 Dorsalgia, unspecified: Secondary | ICD-10-CM | POA: Diagnosis present

## 2019-06-08 DIAGNOSIS — I10 Essential (primary) hypertension: Secondary | ICD-10-CM | POA: Diagnosis present

## 2019-06-08 DIAGNOSIS — F419 Anxiety disorder, unspecified: Secondary | ICD-10-CM | POA: Diagnosis present

## 2019-06-08 DIAGNOSIS — Z7983 Long term (current) use of bisphosphonates: Secondary | ICD-10-CM | POA: Diagnosis not present

## 2019-06-08 DIAGNOSIS — S22080A Wedge compression fracture of T11-T12 vertebra, initial encounter for closed fracture: Secondary | ICD-10-CM | POA: Diagnosis present

## 2019-06-08 DIAGNOSIS — T465X5A Adverse effect of other antihypertensive drugs, initial encounter: Secondary | ICD-10-CM | POA: Diagnosis present

## 2019-06-08 DIAGNOSIS — W19XXXA Unspecified fall, initial encounter: Secondary | ICD-10-CM | POA: Diagnosis present

## 2019-06-08 DIAGNOSIS — U071 COVID-19: Secondary | ICD-10-CM | POA: Diagnosis present

## 2019-06-08 DIAGNOSIS — I951 Orthostatic hypotension: Secondary | ICD-10-CM | POA: Diagnosis present

## 2019-06-08 DIAGNOSIS — M810 Age-related osteoporosis without current pathological fracture: Secondary | ICD-10-CM | POA: Diagnosis present

## 2019-06-08 LAB — TROPONIN I (HIGH SENSITIVITY): Troponin I (High Sensitivity): 7 ng/L (ref <18)

## 2019-06-08 LAB — COMPREHENSIVE METABOLIC PANEL
ALT: 15 U/L (ref 0–44)
AST: 19 U/L (ref 15–41)
Albumin: 3.6 g/dL (ref 3.5–5.0)
Alkaline Phosphatase: 50 U/L (ref 38–126)
Anion gap: 8 (ref 5–15)
BUN: 7 mg/dL — ABNORMAL LOW (ref 8–23)
CO2: 25 mmol/L (ref 22–32)
Calcium: 8.4 mg/dL — ABNORMAL LOW (ref 8.9–10.3)
Chloride: 106 mmol/L (ref 98–111)
Creatinine, Ser: 0.52 mg/dL (ref 0.44–1.00)
GFR calc Af Amer: >60 mL/min (ref >60)
GFR calc non Af Amer: >60 mL/min (ref >60)
Glucose, Bld: 109 mg/dL — ABNORMAL HIGH (ref 70–99)
Potassium: 3.6 mmol/L (ref 3.5–5.1)
Sodium: 139 mmol/L (ref 135–145)
Total Bilirubin: 0.7 mg/dL (ref 0.3–1.2)
Total Protein: 6 g/dL — ABNORMAL LOW (ref 6.5–8.1)

## 2019-06-08 LAB — CBC
HCT: 37.5 % (ref 36.0–46.0)
Hemoglobin: 12.6 g/dL (ref 12.0–15.0)
MCH: 29.6 pg (ref 26.0–34.0)
MCHC: 33.6 g/dL (ref 30.0–36.0)
MCV: 88 fL (ref 80.0–100.0)
Platelets: 207 10*3/uL (ref 150–400)
RBC: 4.26 MIL/uL (ref 3.87–5.11)
RDW: 12.4 % (ref 11.5–15.5)
WBC: 3.1 10*3/uL — ABNORMAL LOW (ref 4.0–10.5)
nRBC: 0 % (ref 0.0–0.2)

## 2019-06-08 MED ORDER — CEFAZOLIN SODIUM-DEXTROSE 1-4 GM/50ML-% IV SOLN
1.0000 g | Freq: Once | INTRAVENOUS | Status: AC
  Administered 2019-06-08: 1 g via INTRAVENOUS
  Filled 2019-06-08: qty 50

## 2019-06-08 MED ORDER — CALCIUM CARBONATE-VITAMIN D 500-200 MG-UNIT PO TABS
1.0000 | ORAL_TABLET | Freq: Two times a day (BID) | ORAL | Status: DC
  Administered 2019-06-08 – 2019-06-10 (×3): 1 via ORAL
  Filled 2019-06-08 (×3): qty 1

## 2019-06-08 MED ORDER — ZINC SULFATE 220 (50 ZN) MG PO CAPS
220.0000 mg | ORAL_CAPSULE | Freq: Two times a day (BID) | ORAL | Status: DC
  Administered 2019-06-08 – 2019-06-10 (×3): 220 mg via ORAL
  Filled 2019-06-08 (×3): qty 1

## 2019-06-08 MED ORDER — ENOXAPARIN SODIUM 40 MG/0.4ML ~~LOC~~ SOLN
40.0000 mg | SUBCUTANEOUS | Status: AC
  Administered 2019-06-08: 40 mg via SUBCUTANEOUS
  Filled 2019-06-08: qty 0.4

## 2019-06-08 MED ORDER — VITAMIN C 500 MG PO TABS
500.0000 mg | ORAL_TABLET | Freq: Two times a day (BID) | ORAL | Status: DC
  Administered 2019-06-08 – 2019-06-10 (×3): 500 mg via ORAL
  Filled 2019-06-08 (×3): qty 1

## 2019-06-08 NOTE — Progress Notes (Signed)
[facility] - [place] at [facility]   PATIENT NAME: Victoria Haley    MR#:  [id number]  DATE OF BIRTH:  1946-07-02  SUBJECTIVE:  CHIEF COMPLAINT: Patient denies any dizziness.  Denies any shortness of breath or cough no fevers.  Back pain is better with the pain medications. Granddaughter is reporting that patient takes 1 extra dose of lisinopril whenever she gets headache, became dizzy and fainted after she took an extra dose of lisinopril prior to the admission  REVIEW OF SYSTEMS:  CONSTITUTIONAL: No fever, fatigue or weakness.  EYES: No blurred or double vision.  EARS, NOSE, AND THROAT: No tinnitus or ear pain.  RESPIRATORY: No cough, shortness of breath, wheezing or hemoptysis.  CARDIOVASCULAR: No chest pain, orthopnea, edema.  GASTROINTESTINAL: No nausea, vomiting, diarrhea or abdominal pain.  GENITOURINARY: No dysuria, hematuria.  ENDOCRINE: No polyuria, nocturia,  HEMATOLOGY: No anemia, easy bruising or bleeding SKIN: No rash or lesion. MUSCULOSKELETAL: No joint pain or arthritis.   NEUROLOGIC: No tingling, numbness, weakness.  PSYCHIATRY: No anxiety or depression.   DRUG ALLERGIES:  No Known Allergies  VITALS:  Blood pressure (!) 103/57, pulse 90, temperature 98.8 F (37.1 C), temperature source Oral, resp. rate 14, height 5\' 2"  (1.575 m), weight 66.2 kg, SpO2 92 %.  PHYSICAL EXAMINATION:  GENERAL:  73 y.o.-year-old patient lying in the bed with no acute distress.  EYES: Pupils equal, round, reactive to light and accommodation. No scleral icterus. Extraocular muscles intact.  HEENT: Head atraumatic, normocephalic. Oropharynx and nasopharynx clear.  NECK:  Supple, no jugular venous distention. No thyroid enlargement, no tenderness.  LUNGS: Normal breath sounds bilaterally, no wheezing, rales,rhonchi or crepitation. No use of accessory muscles of respiration.  CARDIOVASCULAR: S1, S2 normal. No murmurs, rubs, or gallops.  ABDOMEN: Soft,  nontender, nondistended. Bowel sounds present.  EXTREMITIES: No pedal edema, cyanosis, or clubbing.  NEUROLOGIC: Cranial nerves II through XII are intact.  Sensation intact. Gait not checked.  PSYCHIATRIC: The patient is alert and oriented x 3.  SKIN: No obvious rash, lesion, or ulcer.    LABORATORY PANEL:   CBC Recent Labs  Lab 06/08/19 0512  WBC 3.1*  HGB 12.6  HCT 37.5  PLT 207   ------------------------------------------------------------------------------------------------------------------  Chemistries  Recent Labs  Lab 06/08/19 0512  NA 139  K 3.6  CL 106  CO2 25  GLUCOSE 109*  BUN 7*  CREATININE 0.52  CALCIUM 8.4*  AST 19  ALT 15  ALKPHOS 50  BILITOT 0.7   ------------------------------------------------------------------------------------------------------------------  Cardiac Enzymes No results for input(s): TROPONINI in the last 168 hours. ------------------------------------------------------------------------------------------------------------------  RADIOLOGY:  Dg Chest 1 View  Result Date: 06/07/2019 CLINICAL DATA:  Post fall. Patient's husband tested positive for COVID-19. EXAM: CHEST  1 VIEW COMPARISON:  None. FINDINGS: Normal cardiac silhouette and mediastinal contours scratch the borderline enlarged cardiac silhouette and mediastinal contours. Atherosclerotic plaque when the thoracic aorta with mild rightward deviation of the tracheal air, the level of the aortic arch. No focal airspace opacities. No pleural effusion or pneumothorax. There is mild elevation/eventration of the right hemidiaphragm. No evidence of edema. Post cholecystectomy. No acute osseous abnormalities. IMPRESSION: No acute cardiopulmonary disease. Electronically Signed   By: 06/09/2019 M.D.   On: 06/07/2019 07:41   Dg Lumbar Spine Complete  Result Date: 06/07/2019 CLINICAL DATA:  Post fall. EXAM: LUMBAR SPINE - COMPLETE 4+ VIEW COMPARISON:  None. FINDINGS: There are 5 non  rib-bearing lumbar type vertebral bodies. Normal alignment of the lumbar spine. No  anterolisthesis or retrolisthesis. Age-indeterminate severe (approximately 75%) compression deformity involving the T11 vertebral body. Remaining lumbar vertebral body heights appear preserved. Lumbar intervertebral disc space heights appear preserved. Limited visualization the bilateral SI joints is normal. Atherosclerotic plaque within the abdominal aorta. Post cholecystectomy. Regional bowel gas pattern and soft tissues appear normal. IMPRESSION: 1. Age-indeterminate severe (approximately 75%) compression deformity involving the T11 vertebral body. Correlation for point tenderness at this location is advised. 2.  Aortic Atherosclerosis (ICD10-I70.0). Electronically Signed   By: Simonne ComeJohn  Watts M.D.   On: 06/07/2019 07:40   Ct Head Wo Contrast  Result Date: 06/07/2019 CLINICAL DATA:  Fall EXAM: CT HEAD WITHOUT CONTRAST CT CERVICAL SPINE WITHOUT CONTRAST TECHNIQUE: Multidetector CT imaging of the head and cervical spine was performed following the standard protocol without intravenous contrast. Multiplanar CT image reconstructions of the cervical spine were also generated. COMPARISON:  None. FINDINGS: CT HEAD FINDINGS Brain: No evidence of acute infarction, hemorrhage, hydrocephalus, extra-axial collection or mass lesion/mass effect. Mild subcortical white matter and periventricular small vessel ischemic changes. Vascular: Intracranial atherosclerosis. Skull: Normal. Negative for fracture or focal lesion. Sinuses/Orbits: The visualized paranasal sinuses are essentially clear. The mastoid air cells are unopacified. Other: None. CT CERVICAL SPINE FINDINGS Alignment: Normal cervical lordosis. Skull base and vertebrae: No acute fracture. No primary bone lesion or focal pathologic process. Soft tissues and spinal canal: No prevertebral fluid or swelling. No visible canal hematoma. Disc levels: Vertebral body heights and intervertebral  disc spaces are maintained. Mild degenerative changes at C5-6. Spinal canal is patent. Upper chest: Visualized lung apices are clear. Other: Visualized thyroid is unremarkable. IMPRESSION: No evidence of acute intracranial abnormality. Mild small vessel ischemic changes. No evidence of traumatic injury to the cervical spine. Mild degenerative changes. Electronically Signed   By: Charline BillsSriyesh  Krishnan M.D.   On: 06/07/2019 08:03   Ct Cervical Spine Wo Contrast  Result Date: 06/07/2019 CLINICAL DATA:  Fall EXAM: CT HEAD WITHOUT CONTRAST CT CERVICAL SPINE WITHOUT CONTRAST TECHNIQUE: Multidetector CT imaging of the head and cervical spine was performed following the standard protocol without intravenous contrast. Multiplanar CT image reconstructions of the cervical spine were also generated. COMPARISON:  None. FINDINGS: CT HEAD FINDINGS Brain: No evidence of acute infarction, hemorrhage, hydrocephalus, extra-axial collection or mass lesion/mass effect. Mild subcortical white matter and periventricular small vessel ischemic changes. Vascular: Intracranial atherosclerosis. Skull: Normal. Negative for fracture or focal lesion. Sinuses/Orbits: The visualized paranasal sinuses are essentially clear. The mastoid air cells are unopacified. Other: None. CT CERVICAL SPINE FINDINGS Alignment: Normal cervical lordosis. Skull base and vertebrae: No acute fracture. No primary bone lesion or focal pathologic process. Soft tissues and spinal canal: No prevertebral fluid or swelling. No visible canal hematoma. Disc levels: Vertebral body heights and intervertebral disc spaces are maintained. Mild degenerative changes at C5-6. Spinal canal is patent. Upper chest: Visualized lung apices are clear. Other: Visualized thyroid is unremarkable. IMPRESSION: No evidence of acute intracranial abnormality. Mild small vessel ischemic changes. No evidence of traumatic injury to the cervical spine. Mild degenerative changes. Electronically Signed    By: Charline BillsSriyesh  Krishnan M.D.   On: 06/07/2019 08:03   Ct Thoracic Spine Wo Contrast  Result Date: 06/07/2019 CLINICAL DATA:  T11 compression fracture seen by radiography. EXAM: CT THORACIC SPINE WITHOUT CONTRAST TECHNIQUE: Multidetector CT images of the thoracic were obtained using the standard protocol without intravenous contrast. COMPARISON:  Chest CT same day FINDINGS: Alignment: Slightly increased kyphotic curvature at the lower thoracic region because of the T11 fracture. Vertebrae:  Superior endplate fracture at J18 with loss of height anteriorly of 60%. Posterior bowing of the posterosuperior margin of the vertebral body by 4 mm. No posterior element fracture. No other vertebral level fracture. Paraspinal and other soft tissues: Mild surrounding edema. Disc levels: No significant primary disc pathology suspected in the thoracic or upper lumbar region. The canal and foramina are widely patent at all levels with exception of T11. As noted above, at T11 there is posterior bowing of the posterosuperior margin of the vertebral body by 4 mm. This encroaches mildly upon the ventral aspect of the spinal canal but does not likely cause compressive narrowing. IMPRESSION: 1. Superior endplate compression fracture at T11 with loss of height anteriorly of 60%. Posterior bowing of the posterosuperior margin of the vertebral body by 4 mm. No posterior element fracture. This looks like a benign fracture. 2. Slightly increased kyphotic curvature at the lower thoracic region because of the T11 fracture. Electronically Signed   By: Nelson Chimes M.D.   On: 06/07/2019 09:24   Mr Thoracic Spine Wo Contrast  Result Date: 06/07/2019 CLINICAL DATA:  Initial evaluation for acute traumatic compression fracture. EXAM: MRI THORACIC SPINE WITHOUT CONTRAST TECHNIQUE: Multiplanar, multisequence MR imaging of the thoracic spine was performed. No intravenous contrast was administered. COMPARISON:  Prior CT from earlier the same day.  FINDINGS: Alignment: Slight focal kyphotic angulation of the lower thoracic spine at the site of the acute T11 compression fracture. Alignment otherwise normal with preservation of the normal thoracic kyphosis. Vertebrae: Acute burst type compression fracture involving the superior endplate of A41 with up to 60% height loss and 4 mm bony retropulsion, stable from previous CT. No visible extension into the posterior elements. Fracture is benign/mechanical in appearance, with no underlying pathologic lesion identified. Probable small amount of hemorrhage/blood products within the adjacent ventral epidural space noted. Resultant mild spinal stenosis without acute cord injury. Vertebral body height otherwise maintained without evidence for additional acute or chronic fracture. Underlying bone marrow signal intensity within normal limits. Multiple scattered benign hemangiomata noted, most prominent of which measures 12 mm in the T6 vertebral body. No worrisome osseous lesions. No other abnormal marrow edema. Cord: Signal intensity within the thoracic spinal cord is normal. Again, no evidence for acute cord injury at the site of the T11 compression fracture. Conus medullaris terminates at the L1 level. Paraspinal and other soft tissues: Mild paraspinous edema adjacent to the acute T11 compression fracture. Paraspinous soft tissues demonstrate no other acute finding. Patchy opacity seen within the dependent aspect of the right lung base, which could reflect atelectasis or infiltrate. Trace layering bilateral pleural effusions. Partially visualized lungs are otherwise grossly clear. Visualized visceral structures otherwise unremarkable. Disc levels: T11-12: Tiny left paracentral disc protrusion minimally indents the left ventral thecal sac. No significant stenosis or cord impingement. No other significant disc pathology seen elsewhere within the thoracic spine. No significant disc bulging or focal disc herniation. No other  significant canal or foraminal stenosis. IMPRESSION: 1. Acute burst type compression fracture involving the superior endplate of Y60 with up to 60% height loss and 4 mm bony retropulsion, stable from previous CT. Resultant mild spinal stenosis without acute cord injury. 2. No other acute abnormality within the thoracic spine. 3. Tiny left paracentral disc protrusion at T11-12 without stenosis or cord impingement. 4. Patchy opacity within the posterior right lower lobe, which could reflect atelectasis or infiltrate. Correlation with plain film radiography and symptomatology recommended. Electronically Signed   By: Pincus Badder.D.  On: 06/07/2019 23:31    EKG:   Orders placed or performed during the hospital encounter of 06/07/19  . EKG 12-Lead  . EKG 12-Lead  . ED EKG  . ED EKG  . Repeat EKG  . Repeat EKG    ASSESSMENT AND PLAN:  #Syncope-orthostatic hypotension as well as muscle relaxants adverse effect Clinically feeling better Gentle hydration with IV fluids provided Orthostatics are positive, will get repeat orthostatics Monitor patient on telemetry CT head and C-spine are negative Stop muscle relaxants Suggested patient to check her blood pressure prior to taking extra dose of lisinopril and take extra dose of lisinopril if systolic blood pressure is greater than 150 or diastolic greater than 105  #COVID-19 positive Patient is asymptomatic Vitamin C, vitamin D and zinc supplements Continue close monitoring  #Low back pain acute-T11 compression fracture with 60% height loss age indeterminate Scheduled for kyphoplasty by Dr. Rosita Kea on 06/09/2019 Pain management as needed PT assessment after kyphoplasty  #Hypotension chronic Holding blood pressure medications Hydrate with IV fluids  #Chronic history of anxiety continue Klonopin home medication  #History of osteoporosis continue Fosamax 70 mg once a week  DVT prophylaxis with Lovenox subcu      All the  records are reviewed and case discussed with Care Management/Social Workerr. Management plans discussed with the patient, family and the help of Spanish interpreter and they are in agreement.  CODE STATUS: fc   TOTAL TIME TAKING CARE OF THIS PATIENT: 35  minutes.   POSSIBLE D/C IN1-2  DAYS, DEPENDING ON CLINICAL CONDITION.  Note: This dictation was prepared with Dragon dictation along with smaller phrase technology. Any transcriptional errors that result from this process are unintentional.   Ramonita Lab M.D on 06/08/2019 at 1:51 PM  Between 7am to 6pm - Pager - [phone] After 6pm go to [email or web address] - password [email or web address]  [facility]  Office  [phone]  CC: Primary care physician; System, Pcp Not In

## 2019-06-08 NOTE — Progress Notes (Signed)
PT Cancellation Note  Patient Details Name: Ailed Defibaugh MRN: [id number] DOB: 1946-02-04   Cancelled Treatment:    Reason Eval/Treat Not Completed: Other (comment).  Order received and chart reviewed.  Per nursing, pt is SBA to transfer to [facility] and not in need of immediate PT attention.  Will hold today but continue to follow pt to assess therapeutic need.   Roxanne Gates, PT, DPT  Roxanne Gates 06/08/2019, 11:13 AM

## 2019-06-08 NOTE — Consult Note (Signed)
Reason for Consult: T11 compression fracture Referring Physician: Dr. Cassell Smiles is an 73 y.o. female.  HPI: Patient suffered an injury to her back while she was helping her husband who subsequently was admitted with COVID-19.  She has tested positive for this.  She is having severe low back pain and not really able to walk because of this.  While resting in bed she is comfortable but on turning she has significant pain.  She had no prodromal symptoms and had acute onset subsequent CT and MRI confirmed acute T11 compression fracture with 60% compression.  Past Medical History:  Diagnosis Date  . Hypertension     Past Surgical History:  Procedure Laterality Date  . CHOLECYSTECTOMY      History reviewed. No pertinent family history.  Social History:  reports that she has never smoked. She has never used smokeless tobacco. She reports that she does not drink alcohol or use drugs.  Allergies: No Known Allergies  Medications: I have reviewed the patient's current medications.  Results for orders placed or performed during the hospital encounter of 06/07/19 (from the past 48 hour(s))  Comprehensive metabolic panel     Status: Abnormal   Collection Time: 06/07/19  6:24 AM  Result Value Ref Range   Sodium 140 135 - 145 mmol/L   Potassium 3.9 3.5 - 5.1 mmol/L   Chloride 103 98 - 111 mmol/L   CO2 25 22 - 32 mmol/L   Glucose, Bld 158 (H) 70 - 99 mg/dL   BUN 10 8 - 23 mg/dL   Creatinine, Ser 4.26 0.44 - 1.00 mg/dL   Calcium 9.3 8.9 - 83.4 mg/dL   Total Protein 6.9 6.5 - 8.1 g/dL   Albumin 4.2 3.5 - 5.0 g/dL   AST 22 15 - 41 U/L   ALT 18 0 - 44 U/L   Alkaline Phosphatase 54 38 - 126 U/L   Total Bilirubin 1.0 0.3 - 1.2 mg/dL   GFR calc non Af Amer >60 >60 mL/min   GFR calc Af Amer >60 >60 mL/min   Anion gap 12 5 - 15    Comment: Performed at [facility], [address] [place], [place] [address]  CBC with Differential/Platelet     Status: Abnormal   Collection  Time: 06/07/19  6:24 AM  Result Value Ref Range   WBC 3.9 (L) 4.0 - 10.5 K/uL   RBC 4.75 3.87 - 5.11 MIL/uL   Hemoglobin 13.8 12.0 - 15.0 g/dL   HCT 29.7 98.9 - 21.1 %   MCV 87.4 80.0 - 100.0 fL   MCH 29.1 26.0 - 34.0 pg   MCHC 33.3 30.0 - 36.0 g/dL   RDW 94.1 74.0 - 81.4 %   Platelets 237 150 - 400 K/uL   nRBC 0.0 0.0 - 0.2 %   Neutrophils Relative % 65 %   Neutro Abs 2.5 1.7 - 7.7 K/uL   Lymphocytes Relative 26 %   Lymphs Abs 1.0 0.7 - 4.0 K/uL   Monocytes Relative 8 %   Monocytes Absolute 0.3 0.1 - 1.0 K/uL   Eosinophils Relative 0 %   Eosinophils Absolute 0.0 0.0 - 0.5 K/uL   Basophils Relative 0 %   Basophils Absolute 0.0 0.0 - 0.1 K/uL   Immature Granulocytes 1 %   Abs Immature Granulocytes 0.03 0.00 - 0.07 K/uL    Comment: Performed at [facility], [address] [place], [place] [address]  Troponin I (High Sensitivity)  Status: None   Collection Time: 06/07/19  6:24 AM  Result Value Ref Range   Troponin I (High Sensitivity) 4 <18 ng/L    Comment: (NOTE) Elevated high sensitivity troponin I (hsTnI) values and significant  changes across serial measurements may suggest ACS but many other  chronic and acute conditions are known to elevate hsTnI results.  Refer to the "Links" section for chest pain algorithms and additional  guidance. Performed at [facility] [address] [place], [place] [address]   Troponin I (High Sensitivity)     Status: None   Collection Time: 06/07/19  8:37 AM  Result Value Ref Range   Troponin I (High Sensitivity) 4 <18 ng/L    Comment: (NOTE) Elevated high sensitivity troponin I (hsTnI) values and significant  changes across serial measurements may suggest ACS but many other  chronic and acute conditions are known to elevate hsTnI results.  Refer to the "Links" section for chest pain algorithms and additional  guidance. Performed at [facility], [address] [place], [place] [address]   SARS  CORONAVIRUS 2 (TAT 6-24 HRS) Nasopharyngeal Nasopharyngeal Swab     Status: Abnormal   Collection Time: 06/07/19  9:45 AM   Specimen: Nasopharyngeal Swab  Result Value Ref Range   SARS Coronavirus 2 POSITIVE (A) NEGATIVE    Comment: RESULT CALLED TO, READ BACK BY AND VERIFIED WITH: D MOLSON,RN AT 2053 06/07/2019 BY L BENFIELD (NOTE) SARS-CoV-2 target nucleic acids are DETECTED. The SARS-CoV-2 RNA is generally detectable in upper and lower respiratory specimens during the acute phase of infection. Positive results are indicative of active infection with SARS-CoV-2. Clinical  correlation with patient history and other diagnostic information is necessary to determine patient infection status. Positive results do  not rule out bacterial infection or co-infection with other viruses. The expected result is Negative. Fact Sheet for Patients: HairSlick.no Fact Sheet for Healthcare Providers: quierodirigir.com This test is not yet approved or cleared by the [place] FDA and  has been authorized for detection and/or diagnosis of SARS-CoV-2 by FDA under an Emergency Use Authorization (EUA). This EUA will remain  in effect (meaning this test can be [place] ed) for the duration of the COVID-19 declaration under Section 564(b)(1) of the Act, 21 U.S.C. section 360bbb-3(b)(1), unless the authorization is terminated or revoked sooner. Performed at [facility], [address] [place], [place] [address]   Glucose, capillary     Status: Abnormal   Collection Time: 06/07/19 12:59 PM  Result Value Ref Range   Glucose-Capillary 134 (H) 70 - 99 mg/dL  Troponin I (High Sensitivity)     Status: None   Collection Time: 06/07/19  8:54 PM  Result Value Ref Range   Troponin I (High Sensitivity) 5 <18 ng/L    Comment: (NOTE) Elevated high sensitivity troponin I (hsTnI) values and significant  changes across serial measurements may suggest ACS but  many other  chronic and acute conditions are known to elevate hsTnI results.  Refer to the "Links" section for chest pain algorithms and additional  guidance. Performed at [facility], [address] [place], [place] [address]   Troponin I (High Sensitivity)     Status: None   Collection Time: 06/07/19 11:39 PM  Result Value Ref Range   Troponin I (High Sensitivity) 7 <18 ng/L    Comment: (NOTE) Elevated high sensitivity troponin I (hsTnI) values and significant  changes across serial measurements may suggest ACS but many other  chronic and  acute conditions are known to elevate hsTnI results.  Refer to the "Links" section for chest pain algorithms and additional  guidance. Performed at [facility], [address] [place], [place] [address]   CBC     Status: Abnormal   Collection Time: 06/08/19  5:12 AM  Result Value Ref Range   WBC 3.1 (L) 4.0 - 10.5 K/uL   RBC 4.26 3.87 - 5.11 MIL/uL   Hemoglobin 12.6 12.0 - 15.0 g/dL   HCT 81.137.5 91.436.0 - 78.246.0 %   MCV 88.0 80.0 - 100.0 fL   MCH 29.6 26.0 - 34.0 pg   MCHC 33.6 30.0 - 36.0 g/dL   RDW 95.612.4 21.311.5 - 08.615.5 %   Platelets 207 150 - 400 K/uL   nRBC 0.0 0.0 - 0.2 %    Comment: Performed at [facility], [address] [place], [place] [address]  Comprehensive metabolic panel     Status: Abnormal   Collection Time: 06/08/19  5:12 AM  Result Value Ref Range   Sodium 139 135 - 145 mmol/L   Potassium 3.6 3.5 - 5.1 mmol/L   Chloride 106 98 - 111 mmol/L   CO2 25 22 - 32 mmol/L   Glucose, Bld 109 (H) 70 - 99 mg/dL   BUN 7 (L) 8 - 23 mg/dL   Creatinine, Ser 9.620.52 0.44 - 1.00 mg/dL   Calcium 8.4 (L) 8.9 - 10.3 mg/dL   Total Protein 6.0 (L) 6.5 - 8.1 g/dL   Albumin 3.6 3.5 - 5.0 g/dL   AST 19 15 - 41 U/L   ALT 15 0 - 44 U/L   Alkaline Phosphatase 50 38 - 126 U/L   Total Bilirubin 0.7 0.3 - 1.2 mg/dL   GFR calc non Af Amer >60 >60 mL/min   GFR calc Af Amer >60 >60 mL/min   Anion gap 8 5 - 15    Comment:  Performed at [facility], [address] [place], [place] [address]    Dg Chest 1 View  Result Date: 06/07/2019 CLINICAL DATA:  Post fall. Patient's husband tested positive for COVID-19. EXAM: CHEST  1 VIEW COMPARISON:  None. FINDINGS: Normal cardiac silhouette and mediastinal contours scratch the borderline enlarged cardiac silhouette and mediastinal contours. Atherosclerotic plaque when the thoracic aorta with mild rightward deviation of the tracheal air, the level of the aortic arch. No focal airspace opacities. No pleural effusion or pneumothorax. There is mild elevation/eventration of the right hemidiaphragm. No evidence of edema. Post cholecystectomy. No acute osseous abnormalities. IMPRESSION: No acute cardiopulmonary disease. Electronically Signed   By: Simonne ComeJohn  Watts M.D.   On: 06/07/2019 07:41   Dg Lumbar Spine Complete  Result Date: 06/07/2019 CLINICAL DATA:  Post fall. EXAM: LUMBAR SPINE - COMPLETE 4+ VIEW COMPARISON:  None. FINDINGS: There are 5 non rib-bearing lumbar type vertebral bodies. Normal alignment of the lumbar spine. No anterolisthesis or retrolisthesis. Age-indeterminate severe (approximately 75%) compression deformity involving the T11 vertebral body. Remaining lumbar vertebral body heights appear preserved. Lumbar intervertebral disc space heights appear preserved. Limited visualization the bilateral SI joints is normal. Atherosclerotic plaque within the abdominal aorta. Post cholecystectomy. Regional bowel gas pattern and soft tissues appear normal. IMPRESSION: 1. Age-indeterminate severe (approximately 75%) compression deformity involving the T11 vertebral body. Correlation for point tenderness at this location is advised. 2.  Aortic Atherosclerosis (ICD10-I70.0). Electronically Signed   By: Simonne ComeJohn  Watts M.D.   On: 06/07/2019 07:40   Ct Head Wo Contrast  Result Date: 06/07/2019 CLINICAL  DATA:  Fall EXAM: CT HEAD WITHOUT CONTRAST CT CERVICAL SPINE WITHOUT CONTRAST  TECHNIQUE: Multidetector CT imaging of the head and cervical spine was performed following the standard protocol without intravenous contrast. Multiplanar CT image reconstructions of the cervical spine were also generated. COMPARISON:  None. FINDINGS: CT HEAD FINDINGS Brain: No evidence of acute infarction, hemorrhage, hydrocephalus, extra-axial collection or mass lesion/mass effect. Mild subcortical white matter and periventricular small vessel ischemic changes. Vascular: Intracranial atherosclerosis. Skull: Normal. Negative for fracture or focal lesion. Sinuses/Orbits: The visualized paranasal sinuses are essentially clear. The mastoid air cells are unopacified. Other: None. CT CERVICAL SPINE FINDINGS Alignment: Normal cervical lordosis. Skull base and vertebrae: No acute fracture. No primary bone lesion or focal pathologic process. Soft tissues and spinal canal: No prevertebral fluid or swelling. No visible canal hematoma. Disc levels: Vertebral body heights and intervertebral disc spaces are maintained. Mild degenerative changes at C5-6. Spinal canal is patent. Upper chest: Visualized lung apices are clear. Other: Visualized thyroid is unremarkable. IMPRESSION: No evidence of acute intracranial abnormality. Mild small vessel ischemic changes. No evidence of traumatic injury to the cervical spine. Mild degenerative changes. Electronically Signed   By: Julian Hy M.D.   On: 06/07/2019 08:03   Ct Cervical Spine Wo Contrast  Result Date: 06/07/2019 CLINICAL DATA:  Fall EXAM: CT HEAD WITHOUT CONTRAST CT CERVICAL SPINE WITHOUT CONTRAST TECHNIQUE: Multidetector CT imaging of the head and cervical spine was performed following the standard protocol without intravenous contrast. Multiplanar CT image reconstructions of the cervical spine were also generated. COMPARISON:  None. FINDINGS: CT HEAD FINDINGS Brain: No evidence of acute infarction, hemorrhage, hydrocephalus, extra-axial collection or mass  lesion/mass effect. Mild subcortical white matter and periventricular small vessel ischemic changes. Vascular: Intracranial atherosclerosis. Skull: Normal. Negative for fracture or focal lesion. Sinuses/Orbits: The visualized paranasal sinuses are essentially clear. The mastoid air cells are unopacified. Other: None. CT CERVICAL SPINE FINDINGS Alignment: Normal cervical lordosis. Skull base and vertebrae: No acute fracture. No primary bone lesion or focal pathologic process. Soft tissues and spinal canal: No prevertebral fluid or swelling. No visible canal hematoma. Disc levels: Vertebral body heights and intervertebral disc spaces are maintained. Mild degenerative changes at C5-6. Spinal canal is patent. Upper chest: Visualized lung apices are clear. Other: Visualized thyroid is unremarkable. IMPRESSION: No evidence of acute intracranial abnormality. Mild small vessel ischemic changes. No evidence of traumatic injury to the cervical spine. Mild degenerative changes. Electronically Signed   By: Julian Hy M.D.   On: 06/07/2019 08:03   Ct Thoracic Spine Wo Contrast  Result Date: 06/07/2019 CLINICAL DATA:  T11 compression fracture seen by radiography. EXAM: CT THORACIC SPINE WITHOUT CONTRAST TECHNIQUE: Multidetector CT images of the thoracic were obtained using the standard protocol without intravenous contrast. COMPARISON:  Chest CT same day FINDINGS: Alignment: Slightly increased kyphotic curvature at the lower thoracic region because of the T11 fracture. Vertebrae: Superior endplate fracture at G40 with loss of height anteriorly of 60%. Posterior bowing of the posterosuperior margin of the vertebral body by 4 mm. No posterior element fracture. No other vertebral level fracture. Paraspinal and other soft tissues: Mild surrounding edema. Disc levels: No significant primary disc pathology suspected in the thoracic or upper lumbar region. The canal and foramina are widely patent at all levels with exception  of T11. As noted above, at T11 there is posterior bowing of the posterosuperior margin of the vertebral body by 4 mm. This encroaches mildly upon the ventral aspect of the spinal canal but does  not likely cause compressive narrowing. IMPRESSION: 1. Superior endplate compression fracture at T11 with loss of height anteriorly of 60%. Posterior bowing of the posterosuperior margin of the vertebral body by 4 mm. No posterior element fracture. This looks like a benign fracture. 2. Slightly increased kyphotic curvature at the lower thoracic region because of the T11 fracture. Electronically Signed   By: Paulina Fusi M.D.   On: 06/07/2019 09:24   Mr Thoracic Spine Wo Contrast  Result Date: 06/07/2019 CLINICAL DATA:  Initial evaluation for acute traumatic compression fracture. EXAM: MRI THORACIC SPINE WITHOUT CONTRAST TECHNIQUE: Multiplanar, multisequence MR imaging of the thoracic spine was performed. No intravenous contrast was administered. COMPARISON:  Prior CT from earlier the same day. FINDINGS: Alignment: Slight focal kyphotic angulation of the lower thoracic spine at the site of the acute T11 compression fracture. Alignment otherwise normal with preservation of the normal thoracic kyphosis. Vertebrae: Acute burst type compression fracture involving the superior endplate of T11 with up to 60% height loss and 4 mm bony retropulsion, stable from previous CT. No visible extension into the posterior elements. Fracture is benign/mechanical in appearance, with no underlying pathologic lesion identified. Probable small amount of hemorrhage/blood products within the adjacent ventral epidural space noted. Resultant mild spinal stenosis without acute cord injury. Vertebral body height otherwise maintained without evidence for additional acute or chronic fracture. Underlying bone marrow signal intensity within normal limits. Multiple scattered benign hemangiomata noted, most prominent of which measures 12 mm in the T6  vertebral body. No worrisome osseous lesions. No other abnormal marrow edema. Cord: Signal intensity within the thoracic spinal cord is normal. Again, no evidence for acute cord injury at the site of the T11 compression fracture. Conus medullaris terminates at the L1 level. Paraspinal and other soft tissues: Mild paraspinous edema adjacent to the acute T11 compression fracture. Paraspinous soft tissues demonstrate no other acute finding. Patchy opacity seen within the dependent aspect of the right lung base, which could reflect atelectasis or infiltrate. Trace layering bilateral pleural effusions. Partially visualized lungs are otherwise grossly clear. Visualized visceral structures otherwise unremarkable. Disc levels: T11-12: Tiny left paracentral disc protrusion minimally indents the left ventral thecal sac. No significant stenosis or cord impingement. No other significant disc pathology seen elsewhere within the thoracic spine. No significant disc bulging or focal disc herniation. No other significant canal or foraminal stenosis. IMPRESSION: 1. Acute burst type compression fracture involving the superior endplate of T11 with up to 60% height loss and 4 mm bony retropulsion, stable from previous CT. Resultant mild spinal stenosis without acute cord injury. 2. No other acute abnormality within the thoracic spine. 3. Tiny left paracentral disc protrusion at T11-12 without stenosis or cord impingement. 4. Patchy opacity within the posterior right lower lobe, which could reflect atelectasis or infiltrate. Correlation with plain film radiography and symptomatology recommended. Electronically Signed   By: Rise Mu M.D.   On: 06/07/2019 23:31    ROS Blood pressure (!) 103/57, pulse 90, temperature 98.8 F (37.1 C), temperature source Oral, resp. rate 14, height  (1.575 m), weight 66.2 kg, SpO2 92 %. Physical Exam she has extreme tenderness at T11 on percussion with palpable deformity and kyphosis  at that level.  She is neuro and vascular intact distally with no clonus.  Assessment/Plan: Acute severe T11 compression fracture without neurologic deficit but severe pain  recommendation is for T11 kyphoplasty tomorrow if possible because of her inability to ambulate.  I discussed this with the patient with her daughter  who is an ICU nurse here interpreting.  Kennedy Bucker 06/08/2019, 11:35 AM

## 2019-06-08 NOTE — Plan of Care (Signed)

## 2019-06-09 ENCOUNTER — Inpatient Hospital Stay: Payer: Medicare (Managed Care) | Admitting: Certified Registered Nurse Anesthetist

## 2019-06-09 ENCOUNTER — Encounter: Payer: Self-pay | Admitting: Anesthesiology

## 2019-06-09 ENCOUNTER — Inpatient Hospital Stay: Payer: Medicare (Managed Care)

## 2019-06-09 ENCOUNTER — Encounter
Admission: EM | Disposition: A | Discharge status: Home / Self Care | Payer: Self-pay | Source: Home / Self Care | Attending: Internal Medicine

## 2019-06-09 HISTORY — PX: KYPHOPLASTY: SHX5884

## 2019-06-09 SURGERY — KYPHOPLASTY
Anesthesia: Monitor Anesthesia Care | Site: Back

## 2019-06-09 MED ORDER — SODIUM CHLORIDE 0.9 % IV SOLN
INTRAVENOUS | Status: DC | PRN
  Administered 2019-06-09: 17:00:00 via INTRAVENOUS

## 2019-06-09 MED ORDER — LIDOCAINE HCL 1 % IJ SOLN
INTRAMUSCULAR | Status: DC | PRN
  Administered 2019-06-09: 20 mL
  Administered 2019-06-09: 10 mL

## 2019-06-09 MED ORDER — GLYCOPYRROLATE 0.2 MG/ML IJ SOLN
INTRAMUSCULAR | Status: DC | PRN
  Administered 2019-06-09: 0.1 mg via INTRAVENOUS

## 2019-06-09 MED ORDER — KETAMINE HCL 50 MG/ML IJ SOLN
INTRAMUSCULAR | Status: AC
  Filled 2019-06-09: qty 10

## 2019-06-09 MED ORDER — FENTANYL CITRATE (PF) 100 MCG/2ML IJ SOLN
INTRAMUSCULAR | Status: DC | PRN
  Administered 2019-06-09 (×6): 25 ug via INTRAVENOUS

## 2019-06-09 MED ORDER — FENTANYL CITRATE (PF) 100 MCG/2ML IJ SOLN: 25.0000 ug | INTRAMUSCULAR | Status: DC | PRN

## 2019-06-09 MED ORDER — FENTANYL CITRATE (PF) 100 MCG/2ML IJ SOLN
INTRAMUSCULAR | Status: AC
  Filled 2019-06-09: qty 2

## 2019-06-09 MED ORDER — SODIUM CHLORIDE 0.9% FLUSH
3.0000 mL | Freq: Two times a day (BID) | INTRAVENOUS | Status: DC
  Administered 2019-06-09 – 2019-06-10 (×2): 3 mL via INTRAVENOUS

## 2019-06-09 MED ORDER — ONDANSETRON HCL 4 MG/2ML IJ SOLN: 4.0000 mg | Freq: Once | INTRAMUSCULAR | Status: DC | PRN

## 2019-06-09 MED ORDER — MIDAZOLAM HCL 2 MG/2ML IJ SOLN
INTRAMUSCULAR | Status: DC | PRN
  Administered 2019-06-09: 2 mg via INTRAVENOUS

## 2019-06-09 MED ORDER — PROPOFOL 10 MG/ML IV BOLUS
INTRAVENOUS | Status: AC
  Filled 2019-06-09: qty 20

## 2019-06-09 MED ORDER — BUPIVACAINE-EPINEPHRINE (PF) 0.5% -1:200000 IJ SOLN
INTRAMUSCULAR | Status: DC | PRN
  Administered 2019-06-09: 20 mL via PERINEURAL

## 2019-06-09 MED ORDER — LIDOCAINE HCL (PF) 2 % IJ SOLN
INTRAMUSCULAR | Status: AC
  Filled 2019-06-09: qty 10

## 2019-06-09 MED ORDER — MIDAZOLAM HCL 2 MG/2ML IJ SOLN
INTRAMUSCULAR | Status: AC
  Filled 2019-06-09: qty 2

## 2019-06-09 MED ORDER — PROPOFOL 500 MG/50ML IV EMUL
INTRAVENOUS | Status: AC
  Filled 2019-06-09: qty 50

## 2019-06-09 MED ORDER — ENOXAPARIN SODIUM 40 MG/0.4ML ~~LOC~~ SOLN
40.0000 mg | SUBCUTANEOUS | Status: AC
  Administered 2019-06-10: 40 mg via SUBCUTANEOUS
  Filled 2019-06-09: qty 0.4

## 2019-06-09 MED ORDER — ONDANSETRON HCL 4 MG/2ML IJ SOLN
INTRAMUSCULAR | Status: DC | PRN
  Administered 2019-06-09: 4 mg via INTRAVENOUS

## 2019-06-09 MED ORDER — KETAMINE HCL 50 MG/ML IJ SOLN
INTRAMUSCULAR | Status: DC | PRN
  Administered 2019-06-09: 25 mg via INTRAMUSCULAR

## 2019-06-09 MED ORDER — CEFAZOLIN SODIUM-DEXTROSE 1-4 GM/50ML-% IV SOLN
INTRAVENOUS | Status: DC | PRN
  Administered 2019-06-09: 1 g via INTRAVENOUS

## 2019-06-09 SURGICAL SUPPLY — 22 items
CEMENT KYPHON CX01A KIT/MIXER (Cement) ×3 IMPLANT
COVER WAND RF STERILE (DRAPES) ×3 IMPLANT
DERMABOND ADVANCED (GAUZE/BANDAGES/DRESSINGS) ×2
DERMABOND ADVANCED .7 DNX12 (GAUZE/BANDAGES/DRESSINGS) ×1 IMPLANT
DEVICE BIOPSY BONE KYPH (INSTRUMENTS) ×3 IMPLANT
DEVICE BIOPSY BONE KYPHX (INSTRUMENTS) ×3 IMPLANT
DRAPE C-ARM XRAY 36X54 (DRAPES) ×6 IMPLANT
DURAPREP 26ML APPLICATOR (WOUND CARE) ×3 IMPLANT
GLOVE SURG SYN 9.0  PF PI (GLOVE) ×2
GLOVE SURG SYN 9.0 PF PI (GLOVE) ×1 IMPLANT
GOWN SRG 2XL LVL 4 RGLN SLV (GOWNS) ×1 IMPLANT
GOWN STRL NON-REIN 2XL LVL4 (GOWNS) ×2
GOWN STRL REUS W/ TWL LRG LVL3 (GOWN DISPOSABLE) ×1 IMPLANT
GOWN STRL REUS W/TWL LRG LVL3 (GOWN DISPOSABLE) ×2
PACK KYPHOPLASTY (MISCELLANEOUS) ×3 IMPLANT
RENTAL RFA  GENERATOR (MISCELLANEOUS)
RENTAL RFA GENERATOR (MISCELLANEOUS) IMPLANT
STRAP SAFETY 5IN WIDE (MISCELLANEOUS) ×3 IMPLANT
TOWEL OR 17X26 4PK STRL BLUE (TOWEL DISPOSABLE) ×3 IMPLANT
TRAY KYPHOPAK 15/2 EXPRESS (KITS) ×3 IMPLANT
TRAY KYPHOPAK 15/3 EXPRESS 1ST (MISCELLANEOUS) IMPLANT
TRAY KYPHOPAK 20/3 EXPRESS 1ST (MISCELLANEOUS) IMPLANT

## 2019-06-09 NOTE — Progress Notes (Signed)
Pt returned from PACU s/p T11 kyphoplasty.  VSS.  NAD.  No complaints of pain.

## 2019-06-09 NOTE — Progress Notes (Addendum)
Patient with T 11 compression fracture with significant collapse Site marked.  Plan procedure today as an emergency.

## 2019-06-09 NOTE — Plan of Care (Signed)
Awaiting surgical repair of T11 compression fracture.  Pt states it only hurts when she moves; declined analgesia. Denies n/v, dizziness; has steady gait up to [facility] independently.   Problem: Education: Goal: Knowledge of General Education information will improve Description: Including pain rating scale, medication(s)/side effects and non-pharmacologic comfort measures Outcome: Progressing   Problem: Health Behavior/Discharge Planning: Goal: Ability to manage health-related needs will improve Outcome: Progressing   Problem: Clinical Measurements: Goal: Ability to maintain clinical measurements within normal limits will improve Outcome: Progressing Goal: Will remain free from infection Outcome: Progressing Goal: Diagnostic test results will improve Outcome: Progressing Goal: Respiratory complications will improve Outcome: Progressing Goal: Cardiovascular complication will be avoided Outcome: Progressing   Problem: Activity: Goal: Risk for activity intolerance will decrease Outcome: Progressing   Problem: Nutrition: Goal: Adequate nutrition will be maintained Outcome: Progressing   Problem: Coping: Goal: Level of anxiety will decrease Outcome: Progressing   Problem: Elimination: Goal: Will not experience complications related to bowel motility Outcome: Progressing Goal: Will not experience complications related to urinary retention Outcome: Progressing   Problem: Pain Managment: Goal: General experience of comfort will improve Outcome: Progressing   Problem: Safety: Goal: Ability to remain free from injury will improve Outcome: Progressing   Problem: Skin Integrity: Goal: Risk for impaired skin integrity will decrease Outcome: Progressing   Problem: Education: Goal: Knowledge of risk factors and measures for prevention of condition will improve Outcome: Progressing   Problem: Coping: Goal: Psychosocial and spiritual needs will be supported Outcome:  Progressing   Problem: Respiratory: Goal: Will maintain a patent airway Outcome: Progressing Goal: Complications related to the disease process, condition or treatment will be avoided or minimized Outcome: Progressing

## 2019-06-09 NOTE — Progress Notes (Signed)
PT Cancellation Note  Patient Details Name: Victoria Haley MRN: [id number] DOB: 1946-04-02   Cancelled Treatment:    Reason Eval/Treat Not Completed: Medical issues which prohibited therapy(Chart reviewed for re-attempt at patient evaluation.  Scheduled for kyphoplasty to address acute compression fracture this date.  Will hold until procedure complete and patient cleared for activity.)   Karessa Onorato H. Owens Shark, PT, DPT, NCS 06/09/19, 8:35 AM [phone]

## 2019-06-09 NOTE — Progress Notes (Signed)
[facility] - [facility] - [place] at [facility]   PATIENT NAME: Victoria Haley    MR#:  [id number]  DATE OF BIRTH:  03/23/1946  SUBJECTIVE:  CHIEF COMPLAINT: Patient denies any dizziness.  Back pain is improving denies any shortness of breath or cough no fevers.   REVIEW OF SYSTEMS:  CONSTITUTIONAL: No fever, fatigue or weakness.  EYES: No blurred or double vision.  EARS, NOSE, AND THROAT: No tinnitus or ear pain.  RESPIRATORY: No cough, shortness of breath, wheezing or hemoptysis.  CARDIOVASCULAR: No chest pain, orthopnea, edema.  GASTROINTESTINAL: No nausea, vomiting, diarrhea or abdominal pain.  GENITOURINARY: No dysuria, hematuria.  ENDOCRINE: No polyuria, nocturia,  HEMATOLOGY: No anemia, easy bruising or bleeding SKIN: No rash or lesion. MUSCULOSKELETAL: No joint pain or arthritis.   NEUROLOGIC: No tingling, numbness, weakness.  PSYCHIATRY: No anxiety or depression.   DRUG ALLERGIES:  No Known Allergies  VITALS:  Blood pressure 129/69, pulse 78, temperature 99.4 F (37.4 C), temperature source Oral, resp. rate 16, height 5\' 2"  (1.575 m), weight 66.2 kg, SpO2 90 %.  PHYSICAL EXAMINATION:  GENERAL:  73 y.o.-year-old patient lying in the bed with no acute distress.  EYES: Pupils equal, round, reactive to light and accommodation. No scleral icterus. Extraocular muscles intact.  HEENT: Head atraumatic, normocephalic. Oropharynx and nasopharynx clear.  NECK:  Supple, no jugular venous distention. No thyroid enlargement, no tenderness.  LUNGS: Normal breath sounds bilaterally, no wheezing, rales,rhonchi or crepitation. No use of accessory muscles of respiration.  CARDIOVASCULAR: S1, S2 normal. No murmurs, rubs, or gallops.  ABDOMEN: Soft, nontender, nondistended. Bowel sounds present.  EXTREMITIES: No pedal edema, cyanosis, or clubbing.  NEUROLOGIC: Cranial nerves II through XII are intact.  Sensation intact. Gait not checked.  PSYCHIATRIC: The patient is alert  and oriented x 3.  SKIN: No obvious rash, lesion, or ulcer.    LABORATORY PANEL:   CBC Recent Labs  Lab 06/08/19 0512  WBC 3.1*  HGB 12.6  HCT 37.5  PLT 207   ------------------------------------------------------------------------------------------------------------------  Chemistries  Recent Labs  Lab 06/08/19 0512  NA 139  K 3.6  CL 106  CO2 25  GLUCOSE 109*  BUN 7*  CREATININE 0.52  CALCIUM 8.4*  AST 19  ALT 15  ALKPHOS 50  BILITOT 0.7   ------------------------------------------------------------------------------------------------------------------  Cardiac Enzymes No results for input(s): TROPONINI in the last 168 hours. ------------------------------------------------------------------------------------------------------------------  RADIOLOGY:  Mr Thoracic Spine Wo Contrast  Result Date: 06/07/2019 CLINICAL DATA:  Initial evaluation for acute traumatic compression fracture. EXAM: MRI THORACIC SPINE WITHOUT CONTRAST TECHNIQUE: Multiplanar, multisequence MR imaging of the thoracic spine was performed. No intravenous contrast was administered. COMPARISON:  Prior CT from earlier the same day. FINDINGS: Alignment: Slight focal kyphotic angulation of the lower thoracic spine at the site of the acute T11 compression fracture. Alignment otherwise normal with preservation of the normal thoracic kyphosis. Vertebrae: Acute burst type compression fracture involving the superior endplate of T11 with up to 60% height loss and 4 mm bony retropulsion, stable from previous CT. No visible extension into the posterior elements. Fracture is benign/mechanical in appearance, with no underlying pathologic lesion identified. Probable small amount of hemorrhage/blood products within the adjacent ventral epidural space noted. Resultant mild spinal stenosis without acute cord injury. Vertebral body height otherwise maintained without evidence for additional acute or chronic fracture.  Underlying bone marrow signal intensity within normal limits. Multiple scattered benign hemangiomata noted, most prominent of which measures 12 mm in the T6 vertebral body. No worrisome osseous  lesions. No other abnormal marrow edema. Cord: Signal intensity within the thoracic spinal cord is normal. Again, no evidence for acute cord injury at the site of the T11 compression fracture. Conus medullaris terminates at the L1 level. Paraspinal and other soft tissues: Mild paraspinous edema adjacent to the acute T11 compression fracture. Paraspinous soft tissues demonstrate no other acute finding. Patchy opacity seen within the dependent aspect of the right lung base, which could reflect atelectasis or infiltrate. Trace layering bilateral pleural effusions. Partially visualized lungs are otherwise grossly clear. Visualized visceral structures otherwise unremarkable. Disc levels: T11-12: Tiny left paracentral disc protrusion minimally indents the left ventral thecal sac. No significant stenosis or cord impingement. No other significant disc pathology seen elsewhere within the thoracic spine. No significant disc bulging or focal disc herniation. No other significant canal or foraminal stenosis. IMPRESSION: 1. Acute burst type compression fracture involving the superior endplate of Q76 with up to 60% height loss and 4 mm bony retropulsion, stable from previous CT. Resultant mild spinal stenosis without acute cord injury. 2. No other acute abnormality within the thoracic spine. 3. Tiny left paracentral disc protrusion at T11-12 without stenosis or cord impingement. 4. Patchy opacity within the posterior right lower lobe, which could reflect atelectasis or infiltrate. Correlation with plain film radiography and symptomatology recommended. Electronically Signed   By: Jeannine Boga M.D.   On: 06/07/2019 23:31    EKG:   Orders placed or performed during the hospital encounter of 06/07/19  . EKG 12-Lead  . EKG  12-Lead  . ED EKG  . ED EKG  . Repeat EKG  . Repeat EKG    ASSESSMENT AND PLAN:  #Syncope-orthostatic hypotension as well as muscle relaxants adverse effect Clinically feeling better Gentle hydration with IV fluids provided Orthostatics are positive Monitor patient on telemetry CT head and C-spine are negative Stop muscle relaxants Suggested patient to check her blood pressure prior to taking extra dose of lisinopril and take extra dose of lisinopril if systolic blood pressure is greater than 195 or diastolic greater than 093 Repeat orthostatics after kyphoplasty  #COVID-19 positive Patient is asymptomatic Vitamin C, vitamin D and zinc supplements Continue close monitoring, clinically improving  #Low back pain acute-T11 compression fracture with 60% height loss age indeterminate Scheduled for kyphoplasty by Dr. Rudene Christians on 06/09/2019 Pain management as needed PT assessment after kyphoplasty today  #Hypotension chronic Holding blood pressure medications Hydrate with IV fluids  #Chronic history of anxiety continue Klonopin home medication  #History of osteoporosis continue Fosamax 70 mg once a week  DVT prophylaxis with Lovenox subcu      All the records are reviewed and case discussed with Care Management/Social Workerr. Management plans discussed with the patient, family and the help of Spanish interpreter and they are in agreement.  CODE STATUS: fc   TOTAL TIME TAKING CARE OF THIS PATIENT: 35  minutes.   POSSIBLE D/C IN1-2  DAYS, DEPENDING ON CLINICAL CONDITION.  Note: This dictation was prepared with Dragon dictation along with smaller phrase technology. Any transcriptional errors that result from this process are unintentional.   Nicholes Mango M.D on 06/09/2019 at 3:35 PM  Between 7am to 6pm - Pager - [phone] After 6pm go to [email or web address] - password [email or web address] [facility]  Office  [phone]  CC: Primary care physician; System,  Pcp Not In

## 2019-06-09 NOTE — Anesthesia Post-op Follow-up Note (Signed)
Anesthesia QCDR form completed.        

## 2019-06-09 NOTE — Anesthesia Preprocedure Evaluation (Signed)
Anesthesia Evaluation  Patient identified by MRN, date of birth, ID band Patient awake    Reviewed: Allergy & Precautions, NPO status , Patient's Chart, lab work & pertinent test results  Airway Mallampati: III       Dental   Pulmonary neg pulmonary ROS,    Pulmonary exam normal        Cardiovascular hypertension, Normal cardiovascular exam     Neuro/Psych negative neurological ROS  negative psych ROS   GI/Hepatic negative GI ROS, Neg liver ROS,   Endo/Other  negative endocrine ROS  Renal/GU negative Renal ROS  negative genitourinary   Musculoskeletal   Abdominal Normal abdominal exam  (+)   Peds negative pediatric ROS (+)  Hematology negative hematology ROS (+)   Anesthesia Other Findings Past Medical History: No date: Hypertension  Reproductive/Obstetrics                             Anesthesia Physical Anesthesia Plan  ASA: II and emergent  Anesthesia Plan: General   Post-op Pain Management:    Induction:   PONV Risk Score and Plan: TIVA  Airway Management Planned: Simple Face Mask  Additional Equipment:   Intra-op Plan:   Post-operative Plan:   Informed Consent: I have reviewed the patients History and Physical, chart, labs and discussed the procedure including the risks, benefits and alternatives for the proposed anesthesia with the patient or authorized representative who has indicated his/her understanding and acceptance.     Dental advisory given  Plan Discussed with: CRNA and Surgeon  Anesthesia Plan Comments: Engineer, maintenance (IT) discussed risks with patient via language line, along with pertinent information as far as history and the case.)        Anesthesia Quick Evaluation

## 2019-06-09 NOTE — Op Note (Addendum)
Date 06/09/2019  Time  5:20 pm  PATIENT: Victoria Haley   PRE-OPERATIVE DIAGNOSIS:  closed wedge compression fracture of T11   POST-OPERATIVE DIAGNOSIS:  closed wedge compression fracture of T11   PROCEDURE:  Procedure(s): KYPHOPLASTY T11  SURGEON: Laurene Footman, MD   ASSISTANTS: None   ANESTHESIA:   local and MAC   EBL:  No intake/output data recorded.   BLOOD ADMINISTERED:none   DRAINS: none    LOCAL MEDICATIONS USED:  MARCAINE    and XYLOCAINE    SPECIMEN:   T11 vertebral body   DISPOSITION OF SPECIMEN:  Pathology   COUNTS:  YES   TOURNIQUET:  * No tourniquets in log *   IMPLANTS: Bone cement   DICTATION: .Dragon Dictation  patient was brought to the operating room and after adequate anesthesia was obtained the patient was placed prone.  C arm was brought in in good visualization of the affected level obtained on both AP and lateral projections.  After patient identification and timeout procedures were completed, local anesthetic was infiltrated with 10 cc 1% Xylocaine infiltrated subcutaneously.  This is done the area on the both sides of the planned approach.  The back was then prepped and draped in the usual sterile manner and repeat timeout procedure carried out.  A spinal needle was brought down to the pedicle on both sides of  T11 and a 50-50 mix of 1% Xylocaine half percent Sensorcaine with epinephrine total of 20 cc injected.  After allowing this to set a small incision was made and the trocar was advanced into the vertebral body in an extrapedicular fashion.  Biopsy was obtained Drilling was carried out balloon inserted with inflation to  2 cc on each side.  When the cement was appropriate consistency approximately 5 cc were injected into the vertebral body without extravasation, good fill superior to inferior endplates and from right to left sides along the inferior endplate.  After the cement had set the trochar was removed and permanent C-arm views obtained.  The  wound was closed with Dermabond followed by Band-Aid   PLAN OF CARE:  Continue as inpatient   PATIENT DISPOSITION:  PACU - hemodynamically stable.

## 2019-06-09 NOTE — Transfer of Care (Signed)
Immediate Anesthesia Transfer of Care Note  Patient: Victoria Haley  Procedure(s) Performed: KYPHOPLASTY T11, COVID POSITIVE (N/A Back)  Patient Location: Nursing Unit  Anesthesia Type:MAC  Level of Consciousness: awake, alert  and oriented  Airway & Oxygen Therapy: Patient Spontanous Breathing  Post-op Assessment: Report given to RN, Post -op Vital signs reviewed and stable and Patient moving all extremities X 4  Post vital signs: Reviewed and stable  Last Vitals:  Vitals Value Taken Time  BP 121/54 06/09/19 1742  Temp 36.7 C 06/09/19 1742  Pulse 74 06/09/19 1742  Resp 12 06/09/19 1742  SpO2 97 % 06/09/19 1742    Last Pain:  Vitals:   06/09/19 1742  TempSrc: Skin  PainSc: 0-No pain         Complications: No apparent anesthesia complications

## 2019-06-10 ENCOUNTER — Encounter: Payer: Self-pay | Admitting: Orthopedic Surgery

## 2019-06-10 MED ORDER — ASCORBIC ACID 500 MG PO TABS: 500.0000 mg | ORAL_TABLET | Freq: Two times a day (BID) | ORAL | 0 refills | Status: AC

## 2019-06-10 MED ORDER — ZINC SULFATE 220 (50 ZN) MG PO CAPS: 220.0000 mg | ORAL_CAPSULE | Freq: Two times a day (BID) | ORAL | 0 refills | Status: AC

## 2019-06-10 MED ORDER — CALCIUM CARBONATE-VITAMIN D 500-200 MG-UNIT PO TABS: 1.0000 | ORAL_TABLET | Freq: Two times a day (BID) | ORAL | 0 refills | Status: AC

## 2019-06-10 MED ORDER — LISINOPRIL 5 MG PO TABS: 5.0000 mg | ORAL_TABLET | Freq: Every day | ORAL | Status: DC

## 2019-06-10 NOTE — Anesthesia Postprocedure Evaluation (Signed)
Anesthesia Post Note  Patient: Victoria Haley  Procedure(s) Performed: KYPHOPLASTY T11, COVID POSITIVE (N/A Back)  Patient location during evaluation: Other Anesthesia Type: MAC Level of consciousness: awake and alert and oriented Pain management: pain level controlled Vital Signs Assessment: post-procedure vital signs reviewed and stable Respiratory status: spontaneous breathing Cardiovascular status: blood pressure returned to baseline Anesthetic complications: no     Last Vitals:  Vitals:   06/09/19 1759 06/09/19 2143  BP: (!) 119/53 131/61  Pulse: 76 (!) 101  Resp: 16 17  Temp: 37.6 C (!) 38 C  SpO2: 95% 90%    Last Pain:  Vitals:   06/09/19 2144  TempSrc:   PainSc: 0-No pain                 Huntleigh Doolen

## 2019-06-10 NOTE — Progress Notes (Signed)
PT Screen Note  Patient Details Name: Victoria Haley MRN: [id number] DOB: 11-Jul-1946   Cancelled Treatment:    Reason Eval/Treat Not Completed: PT screened, no needs identified, will sign off Spoke with patient's granddaughter who initially states "I really don't think she needs PT."  She states that the pt hasn't needed any help with getting to/from bed/bathroom, granddaughter just assisting with IV/lines, etc.  She reports that pt has not been complaining of any pain and that she will be home with her children, of which her son will be around 24/7 and who is able/willing to help.  Family deferred formal PT eval at this time, she does not appear to have any PT needs (granddaughter states "She is getting around like her baseline") and is safe for discharge home.  PT will sign off.  Kreg Shropshire, DPT 06/10/2019, 2:54 PM

## 2019-06-10 NOTE — Discharge Summary (Signed)
[facility] - [place] at [facility]   PATIENT NAME: Victoria Haley    MR#:  [id number]  DATE OF BIRTH:  [birth year]  DATE OF ADMISSION:  06/07/2019 ADMITTING PHYSICIAN: Ramonita Lab, MD  DATE OF DISCHARGE: 06/10/19  PRIMARY CARE PHYSICIAN: System, Pcp Not In    ADMISSION DIAGNOSIS:  Fall, initial encounter [W19.XXXA] Syncope, unspecified syncope type [R55]  DISCHARGE DIAGNOSIS:  Active Problems:   Syncope   Fall Status post kyphoplasty  SECONDARY DIAGNOSIS:   Past Medical History:  Diagnosis Date  . Hypertension     HOSPITAL COURSE:  HPI  Victoria Haley  is a 73 y.o. female with a known history of hypertension but blood pressure is usually low, anxiety is brought into the ED after she sustained a syncopal episode.  Patient was feeling nauseous.  CT head is negative CT CT cervical spine is negative.  Patient was complaining of back pain and lumbar x-ray has revealed a T11 compression fracture with 60% height loss age indeterminate.  Patient's husband was positive for Covid and while she was helping her husband to toilet she felt sweaty and lightheaded.  This happened after she took Tylenol flu and passed out fell backwards.  Daughter-in-law brought her into the hospital.  Patient is orthostatic.  Otherwise denies any chest pain or shortness of breath  #Syncope-orthostatic hypotension as well as muscle relaxants adverse effect Clinically feeling better Gentle hydration with IV fluids provided Orthostatics are positive soon after admission but resolved after IV fluids Monitored patient on telemetry CT head and C-spine are negative Stop muscle relaxants Suggested patient to check her blood pressure prior to taking extra dose of lisinopril and take extra dose of lisinopril if systolic blood pressure is greater than 150 or diastolic greater than 105 Repeat orthostatics after kyphoplasty are ok , pt is asymptomatic  #COVID-19 positive Patient is  asymptomatic Vitamin C, vitamin D and zinc supplements Continue close monitoring, clinically improving  #Low back pain acute-T11 compression fracture with 60% height loss age indeterminate Patient had kyphoplasty by Dr. Rosita Kea on 06/09/2019.Marland Kitchen  Tolerated procedure very well.  Follow-up with [facility] orthopedics in 2 weeks following discharge Pain management as needed PT assessment after kyphoplasty - no PT needs identified as pt was walking after kyphoplasty  Without diff  #Hypotension chronic Resolved, resumed  blood pressure medication- lisinopril  Hydrated with IV fluids   #Chronic history of anxiety continue Klonopin home medication  #History of osteoporosis continue Fosamax 70 mg once a week  DVT prophylaxis with Lovenox subcu   DISCHARGE CONDITIONS:   fair  CONSULTS OBTAINED:  Treatment Team:  Kennedy Bucker, MD   PROCEDURES kyphoplasty 06/09/2019  DRUG ALLERGIES:  No Known Allergies  DISCHARGE MEDICATIONS:   Allergies as of 06/10/2019   No Known Allergies     Medication List    STOP taking these medications   tiZANidine 2 MG tablet Commonly known as: ZANAFLEX     TAKE these medications   ascorbic acid 500 MG tablet Commonly known as: VITAMIN C Take 1 tablet (500 mg total) by mouth 2 (two) times daily.   calcium-vitamin D 500-200 MG-UNIT tablet Commonly known as: OSCAL WITH D Take 1 tablet by mouth 2 (two) times daily.   clonazePAM 0.5 MG tablet Commonly known as: KLONOPIN Take 0.5 mg by mouth daily as needed.   lisinopril 5 MG tablet Commonly known as: ZESTRIL Take 5 mg by mouth daily.   lovastatin 20 MG tablet Commonly known as: MEVACOR Take 20 mg  by mouth daily.   naproxen 500 MG tablet Commonly known as: NAPROSYN Take 500 mg by mouth 2 (two) times daily as needed.   zinc sulfate 220 (50 Zn) MG capsule Take 1 capsule (220 mg total) by mouth 2 (two) times daily.        DISCHARGE INSTRUCTIONS:   Follow-up with primary care  physician in 3 days Follow-up with Dr. Rosita Kea in 2 weeks Continue self quarantining for a total of 2 weeks DIET:  Cardiac diet  DISCHARGE CONDITION:  Fair  ACTIVITY:  Activity as tolerated  OXYGEN:  Home Oxygen: Yes.     Oxygen Delivery: room air  DISCHARGE LOCATION:  home   If you experience worsening of your admission symptoms, develop shortness of breath, life threatening emergency, suicidal or homicidal thoughts you must seek medical attention immediately by calling 911 or calling your MD immediately  if symptoms less severe.  You Must read complete instructions/literature along with all the possible adverse reactions/side effects for all the Medicines you take and that have been prescribed to you. Take any new Medicines after you have completely understood and accpet all the possible adverse reactions/side effects.   Please note  You were cared for by a hospitalist during your hospital stay. If you have any questions about your discharge medications or the care you received while you were in the hospital after you are discharged, you can call the unit and asked to speak with the hospitalist on call if the hospitalist that took care of you is not available. Once you are discharged, your primary care physician will handle any further medical issues. Please note that NO REFILLS for any discharge medications will be authorized once you are discharged, as it is imperative that you return to your primary care physician (or establish a relationship with a primary care physician if you do not have one) for your aftercare needs so that they can reassess your need for medications and monitor your lab values.     Today  Chief Complaint  Patient presents with  . Fall   Patient is doing much better denies any back pain and not requiring any pain medications ambulating room without any difficulty as reported by granddaughter.  ROS:  CONSTITUTIONAL: Denies fevers, chills. Denies any  fatigue, weakness.  EYES: Denies blurry vision, double vision, eye pain. EARS, NOSE, THROAT: Denies tinnitus, ear pain, hearing loss. RESPIRATORY: Denies cough, wheeze, shortness of breath.  CARDIOVASCULAR: Denies chest pain, palpitations, edema.  GASTROINTESTINAL: Denies nausea, vomiting, diarrhea, abdominal pain. Denies bright red blood per rectum. GENITOURINARY: Denies dysuria, hematuria. ENDOCRINE: Denies nocturia or thyroid problems. HEMATOLOGIC AND LYMPHATIC: Denies easy bruising or bleeding. SKIN: Denies rash or lesion. MUSCULOSKELETAL: Denies pain in neck, back, shoulder, knees, hips or arthritic symptoms.  NEUROLOGIC: Denies paralysis, paresthesias.  PSYCHIATRIC: Denies anxiety or depressive symptoms.   VITAL SIGNS:  Blood pressure (!) 154/75, pulse (!) 101, temperature 99.3 F (37.4 C), temperature source Oral, resp. rate 18, height  (1.575 m), weight 66.2 kg, SpO2 90 %.  I/O:    Intake/Output Summary (Last 24 hours) at 06/10/2019 1557 Last data filed at 06/09/2019 1735 Gross per 24 hour  Intake 250 ml  Output -  Net 250 ml    PHYSICAL EXAMINATION:  GENERAL:  73 y.o.-year-old patient lying in the bed with no acute distress.  EYES: Pupils equal, round, reactive to light and accommodation. No scleral icterus. Extraocular muscles intact.  HEENT: Head atraumatic, normocephalic. Oropharynx and nasopharynx clear.  NECK:  Supple, no jugular venous distention. No thyroid enlargement, no tenderness.  LUNGS: Normal breath sounds bilaterally, no wheezing, rales,rhonchi or crepitation. No use of accessory muscles of respiration.  CARDIOVASCULAR: S1, S2 normal. No murmurs, rubs, or gallops.  ABDOMEN: Soft, non-tender, non-distended. Bowel sounds present. No organomegaly or mass.  EXTREMITIES: No pedal edema, cyanosis, or clubbing.  NEUROLOGIC: Cranial nerves II through XII are intact. Muscle strength 5/5 in all extremities. Sensation intact. Gait not checked.  PSYCHIATRIC:  The patient is alert and oriented x 3.  SKIN: No obvious rash, lesion, or ulcer.   DATA REVIEW:   CBC Recent Labs  Lab 06/08/19 0512  WBC 3.1*  HGB 12.6  HCT 37.5  PLT 207    Chemistries  Recent Labs  Lab 06/08/19 0512  NA 139  K 3.6  CL 106  CO2 25  GLUCOSE 109*  BUN 7*  CREATININE 0.52  CALCIUM 8.4*  AST 19  ALT 15  ALKPHOS 50  BILITOT 0.7    Cardiac Enzymes No results for input(s): TROPONINI in the last 168 hours.  Microbiology Results  Results for orders placed or performed during the hospital encounter of 06/07/19  SARS CORONAVIRUS 2 (TAT 6-24 HRS) Nasopharyngeal Nasopharyngeal Swab     Status: Abnormal   Collection Time: 06/07/19  9:45 AM   Specimen: Nasopharyngeal Swab  Result Value Ref Range Status   SARS Coronavirus 2 POSITIVE (A) NEGATIVE Final    Comment: RESULT CALLED TO, READ BACK BY AND VERIFIED WITH: D MOLSON,RN AT 2053 06/07/2019 BY L BENFIELD (NOTE) SARS-CoV-2 target nucleic acids are DETECTED. The SARS-CoV-2 RNA is generally detectable in upper and lower respiratory specimens during the acute phase of infection. Positive results are indicative of active infection with SARS-CoV-2. Clinical  correlation with patient history and other diagnostic information is necessary to determine patient infection status. Positive results do  not rule out bacterial infection or co-infection with other viruses. The expected result is Negative. Fact Sheet for Patients: HairSlick.nohttps://www.fda.gov/media/138098/download Fact Sheet for Healthcare Providers: quierodirigir.comhttps://www.fda.gov/media/138095/download This test is not yet approved or cleared by the [place] FDA and  has been authorized for detection and/or diagnosis of SARS-CoV-2 by FDA under an Emergency Use Authorization (EUA). This EUA will remain  in effect (meaning this test can be us ed) for the duration of the COVID-19 declaration under Section 564(b)(1) of the Act, 21 U.S.C. section 360bbb-3(b)(1), unless  the authorization is terminated or revoked sooner. Performed at [facility], [address] [place], [place] [address]     RADIOLOGY:  Dg Chest 1 View  Result Date: 06/07/2019 CLINICAL DATA:  Post fall. Patient's husband tested positive for COVID-19. EXAM: CHEST  1 VIEW COMPARISON:  None. FINDINGS: Normal cardiac silhouette and mediastinal contours scratch the borderline enlarged cardiac silhouette and mediastinal contours. Atherosclerotic plaque when the thoracic aorta with mild rightward deviation of the tracheal air, the level of the aortic arch. No focal airspace opacities. No pleural effusion or pneumothorax. There is mild elevation/eventration of the right hemidiaphragm. No evidence of edema. Post cholecystectomy. No acute osseous abnormalities. IMPRESSION: No acute cardiopulmonary disease. Electronically Signed   By: Simonne ComeJohn  Watts M.D.   On: 06/07/2019 07:41   Dg Thoracic Spine 2 View  Result Date: 06/09/2019 CLINICAL DATA:  T11 kyphoplasty. EXAM: THORACIC SPINE 2 VIEWS; DG C-ARM 1-60 MIN Radiation exposure index: 7.2 mGy. COMPARISON:  MRI of June 07, 2019. FINDINGS: Two intraoperative fluoroscopic images were obtained of the lower thoracic spine. These demonstrate the patient be  status post kyphoplasty of T11 vertebral body. IMPRESSION: Fluoroscopic guidance provided during T11 kyphoplasty. Electronically Signed   By: Lupita Raider M.D.   On: 06/09/2019 18:04   Dg Lumbar Spine Complete  Result Date: 06/07/2019 CLINICAL DATA:  Post fall. EXAM: LUMBAR SPINE - COMPLETE 4+ VIEW COMPARISON:  None. FINDINGS: There are 5 non rib-bearing lumbar type vertebral bodies. Normal alignment of the lumbar spine. No anterolisthesis or retrolisthesis. Age-indeterminate severe (approximately 75%) compression deformity involving the T11 vertebral body. Remaining lumbar vertebral body heights appear preserved. Lumbar intervertebral disc space heights appear preserved. Limited visualization the  bilateral SI joints is normal. Atherosclerotic plaque within the abdominal aorta. Post cholecystectomy. Regional bowel gas pattern and soft tissues appear normal. IMPRESSION: 1. Age-indeterminate severe (approximately 75%) compression deformity involving the T11 vertebral body. Correlation for point tenderness at this location is advised. 2.  Aortic Atherosclerosis (ICD10-I70.0). Electronically Signed   By: Simonne Come M.D.   On: 06/07/2019 07:40   Ct Head Wo Contrast  Result Date: 06/07/2019 CLINICAL DATA:  Fall EXAM: CT HEAD WITHOUT CONTRAST CT CERVICAL SPINE WITHOUT CONTRAST TECHNIQUE: Multidetector CT imaging of the head and cervical spine was performed following the standard protocol without intravenous contrast. Multiplanar CT image reconstructions of the cervical spine were also generated. COMPARISON:  None. FINDINGS: CT HEAD FINDINGS Brain: No evidence of acute infarction, hemorrhage, hydrocephalus, extra-axial collection or mass lesion/mass effect. Mild subcortical white matter and periventricular small vessel ischemic changes. Vascular: Intracranial atherosclerosis. Skull: Normal. Negative for fracture or focal lesion. Sinuses/Orbits: The visualized paranasal sinuses are essentially clear. The mastoid air cells are unopacified. Other: None. CT CERVICAL SPINE FINDINGS Alignment: Normal cervical lordosis. Skull base and vertebrae: No acute fracture. No primary bone lesion or focal pathologic process. Soft tissues and spinal canal: No prevertebral fluid or swelling. No visible canal hematoma. Disc levels: Vertebral body heights and intervertebral disc spaces are maintained. Mild degenerative changes at C5-6. Spinal canal is patent. Upper chest: Visualized lung apices are clear. Other: Visualized thyroid is unremarkable. IMPRESSION: No evidence of acute intracranial abnormality. Mild small vessel ischemic changes. No evidence of traumatic injury to the cervical spine. Mild degenerative changes.  Electronically Signed   By: Charline Bills M.D.   On: 06/07/2019 08:03   Ct Cervical Spine Wo Contrast  Result Date: 06/07/2019 CLINICAL DATA:  Fall EXAM: CT HEAD WITHOUT CONTRAST CT CERVICAL SPINE WITHOUT CONTRAST TECHNIQUE: Multidetector CT imaging of the head and cervical spine was performed following the standard protocol without intravenous contrast. Multiplanar CT image reconstructions of the cervical spine were also generated. COMPARISON:  None. FINDINGS: CT HEAD FINDINGS Brain: No evidence of acute infarction, hemorrhage, hydrocephalus, extra-axial collection or mass lesion/mass effect. Mild subcortical white matter and periventricular small vessel ischemic changes. Vascular: Intracranial atherosclerosis. Skull: Normal. Negative for fracture or focal lesion. Sinuses/Orbits: The visualized paranasal sinuses are essentially clear. The mastoid air cells are unopacified. Other: None. CT CERVICAL SPINE FINDINGS Alignment: Normal cervical lordosis. Skull base and vertebrae: No acute fracture. No primary bone lesion or focal pathologic process. Soft tissues and spinal canal: No prevertebral fluid or swelling. No visible canal hematoma. Disc levels: Vertebral body heights and intervertebral disc spaces are maintained. Mild degenerative changes at C5-6. Spinal canal is patent. Upper chest: Visualized lung apices are clear. Other: Visualized thyroid is unremarkable. IMPRESSION: No evidence of acute intracranial abnormality. Mild small vessel ischemic changes. No evidence of traumatic injury to the cervical spine. Mild degenerative changes. Electronically Signed   By: Charline Bills  M.D.   On: 06/07/2019 08:03   Ct Thoracic Spine Wo Contrast  Result Date: 06/07/2019 CLINICAL DATA:  T11 compression fracture seen by radiography. EXAM: CT THORACIC SPINE WITHOUT CONTRAST TECHNIQUE: Multidetector CT images of the thoracic were obtained using the standard protocol without intravenous contrast. COMPARISON:   Chest CT same day FINDINGS: Alignment: Slightly increased kyphotic curvature at the lower thoracic region because of the T11 fracture. Vertebrae: Superior endplate fracture at T11 with loss of height anteriorly of 60%. Posterior bowing of the posterosuperior margin of the vertebral body by 4 mm. No posterior element fracture. No other vertebral level fracture. Paraspinal and other soft tissues: Mild surrounding edema. Disc levels: No significant primary disc pathology suspected in the thoracic or upper lumbar region. The canal and foramina are widely patent at all levels with exception of T11. As noted above, at T11 there is posterior bowing of the posterosuperior margin of the vertebral body by 4 mm. This encroaches mildly upon the ventral aspect of the spinal canal but does not likely cause compressive narrowing. IMPRESSION: 1. Superior endplate compression fracture at T11 with loss of height anteriorly of 60%. Posterior bowing of the posterosuperior margin of the vertebral body by 4 mm. No posterior element fracture. This looks like a benign fracture. 2. Slightly increased kyphotic curvature at the lower thoracic region because of the T11 fracture. Electronically Signed   By: Paulina Fusi M.D.   On: 06/07/2019 09:24   Mr Thoracic Spine Wo Contrast  Result Date: 06/07/2019 CLINICAL DATA:  Initial evaluation for acute traumatic compression fracture. EXAM: MRI THORACIC SPINE WITHOUT CONTRAST TECHNIQUE: Multiplanar, multisequence MR imaging of the thoracic spine was performed. No intravenous contrast was administered. COMPARISON:  Prior CT from earlier the same day. FINDINGS: Alignment: Slight focal kyphotic angulation of the lower thoracic spine at the site of the acute T11 compression fracture. Alignment otherwise normal with preservation of the normal thoracic kyphosis. Vertebrae: Acute burst type compression fracture involving the superior endplate of T11 with up to 60% height loss and 4 mm bony  retropulsion, stable from previous CT. No visible extension into the posterior elements. Fracture is benign/mechanical in appearance, with no underlying pathologic lesion identified. Probable small amount of hemorrhage/blood products within the adjacent ventral epidural space noted. Resultant mild spinal stenosis without acute cord injury. Vertebral body height otherwise maintained without evidence for additional acute or chronic fracture. Underlying bone marrow signal intensity within normal limits. Multiple scattered benign hemangiomata noted, most prominent of which measures 12 mm in the T6 vertebral body. No worrisome osseous lesions. No other abnormal marrow edema. Cord: Signal intensity within the thoracic spinal cord is normal. Again, no evidence for acute cord injury at the site of the T11 compression fracture. Conus medullaris terminates at the L1 level. Paraspinal and other soft tissues: Mild paraspinous edema adjacent to the acute T11 compression fracture. Paraspinous soft tissues demonstrate no other acute finding. Patchy opacity seen within the dependent aspect of the right lung base, which could reflect atelectasis or infiltrate. Trace layering bilateral pleural effusions. Partially visualized lungs are otherwise grossly clear. Visualized visceral structures otherwise unremarkable. Disc levels: T11-12: Tiny left paracentral disc protrusion minimally indents the left ventral thecal sac. No significant stenosis or cord impingement. No other significant disc pathology seen elsewhere within the thoracic spine. No significant disc bulging or focal disc herniation. No other significant canal or foraminal stenosis. IMPRESSION: 1. Acute burst type compression fracture involving the superior endplate of T11 with up to 60% height loss  and 4 mm bony retropulsion, stable from previous CT. Resultant mild spinal stenosis without acute cord injury. 2. No other acute abnormality within the thoracic spine. 3. Tiny left  paracentral disc protrusion at T11-12 without stenosis or cord impingement. 4. Patchy opacity within the posterior right lower lobe, which could reflect atelectasis or infiltrate. Correlation with plain film radiography and symptomatology recommended. Electronically Signed   By: Jeannine Boga M.D.   On: 06/07/2019 23:31   Dg C-arm 1-60 Min  Result Date: 06/09/2019 CLINICAL DATA:  T11 kyphoplasty. EXAM: THORACIC SPINE 2 VIEWS; DG C-ARM 1-60 MIN Radiation exposure index: 7.2 mGy. COMPARISON:  MRI of June 07, 2019. FINDINGS: Two intraoperative fluoroscopic images were obtained of the lower thoracic spine. These demonstrate the patient be status post kyphoplasty of T11 vertebral body. IMPRESSION: Fluoroscopic guidance provided during T11 kyphoplasty. Electronically Signed   By: Marijo Conception M.D.   On: 06/09/2019 18:04    EKG:   Orders placed or performed during the hospital encounter of 06/07/19  . EKG 12-Lead  . EKG 12-Lead  . ED EKG  . ED EKG  . Repeat EKG  . Repeat EKG      Management plans discussed with the patient, family and they are in agreement.  CODE STATUS:     Code Status Orders  (From admission, onward)         Start     Ordered   06/07/19 1531  Full code  Continuous     06/07/19 1530        Code Status History    This patient has a current code status but no historical code status.   Advance Care Planning Activity      TOTAL TIME TAKING CARE OF THIS PATIENT:45  minutes.   Note: This dictation was prepared with Dragon dictation along with smaller phrase technology. Any transcriptional errors that result from this process are unintentional.   @M[facility] @  on 06/10/2019 at 3:57 PM  Between 7am to 6pm - Pager - [phone]  After 6pm go to [email or web address] - password [email or web address] [facility]  Office  [phone]  CC: Primary care physician; System, Pcp Not In

## 2019-06-10 NOTE — Progress Notes (Addendum)
   Subjective: 1 Day Post-Op Procedure(s) (LRB): KYPHOPLASTY T11, COVID POSITIVE (N/A)  Patient reports pain as mild. Pain improved after Kyphoplasty. Patient is with no complaints Denies any leg pain We will start therapy today. Feels as if she scan walk better  Objective: Vital signs in last 24 hours: Temp:  [98.1 F (36.7 C)-100.4 F (38 C)] 99.3 F (37.4 C) (10/20 0809) Pulse Rate:  [74-101] 88 (10/20 0809) Resp:  [12-18] 18 (10/20 0809) BP: (119-140)/(53-61) 140/55 (10/20 0809) SpO2:  [90 %-97 %] 90 % (10/20 0809)  Intake/Output from previous day: 10/19 0701 - 10/20 0700 In: 250 [I.V.:200; IV Piggyback:50] Out: -  Intake/Output this shift: No intake/output data recorded.  Recent Labs    06/08/19 0512  HGB 12.6   Recent Labs    06/08/19 0512  WBC 3.1*  RBC 4.26  HCT 37.5  PLT 207   Recent Labs    06/08/19 0512  NA 139  K 3.6  CL 106  CO2 25  BUN 7*  CREATININE 0.52  GLUCOSE 109*  CALCIUM 8.4*   No results for input(s): LABPT, INR in the last 72 hours.  EXAM General - Patient is Alert, Appropriate and Oriented Extremity - Neurovascular intact Sensation intact distally Intact pulses distally  Spine - non tender to palpation along spinous process thoracolumbar spine Dressing - dressing C/D/I and no drainage Motor Function - intact, moving foot and toes well on exam.  Past Medical History:  Diagnosis Date  . Hypertension     Assessment/Plan:   1 Day Post-Op Procedure(s) (LRB): KYPHOPLASTY T11, COVID POSITIVE (N/A) Active Problems:   Syncope   Fall  Estimated body mass index is 26.69 kg/m as calculated from the following:   Height as of this encounter: 5\' 2"  (1.575 m).   Weight as of this encounter: 66.2 kg. Advance diet Up with therapy, WBAT  Pain much improved. Follow up with [facility] in 2 weeks for recheck Ok to remove Band-Aids and shower   T. Rachelle Hora, PA-C [place] 06/10/2019, 10:59 AM

## 2019-06-10 NOTE — Discharge Instructions (Signed)
Follow-up with primary care physician in 3 days Follow-up with Dr. Rudene Christians in 2 weeks

## 2019-06-11 LAB — SURGICAL PATHOLOGY

## 2021-01-14 IMAGING — CT CT HEAD W/O CM
3 series · 15 of 46 positions shown, 18 images · non-contrast
Comparison: None.

CLINICAL DATA: Fall

EXAM:
CT HEAD WITHOUT CONTRAST
CT CERVICAL SPINE WITHOUT CONTRAST
TECHNIQUE: Multidetector CT imaging of the head and cervical spine was
performed following the standard protocol without intravenous
contrast. Multiplanar CT image reconstructions of the cervical spine
were also generated.

[Series 2: head wo · axial · 0.47mm/px · z∈[-115,+5]mm · 9 of 29 slices shown, 12 images]
[im 3/29  brain]
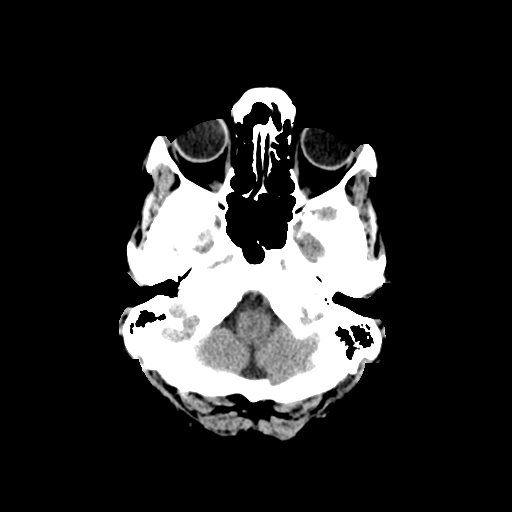
[im 3/29  bone]
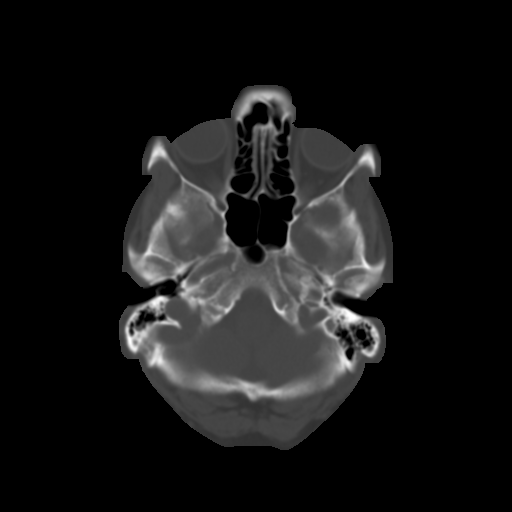
[im 6/29  brain]
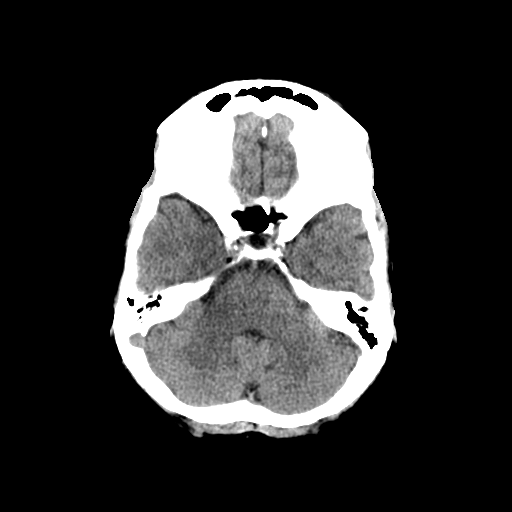
[im 9/29  brain]
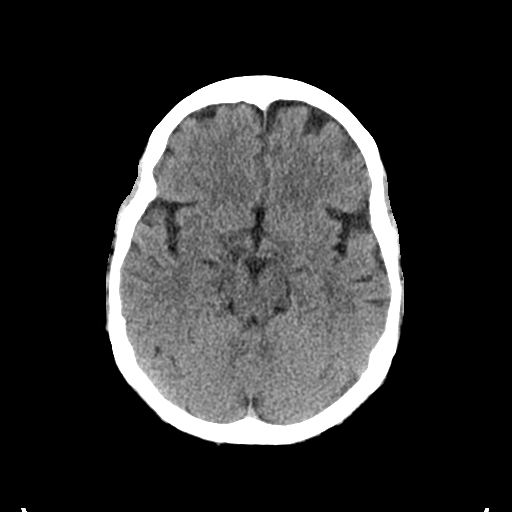
[im 12/29  brain]
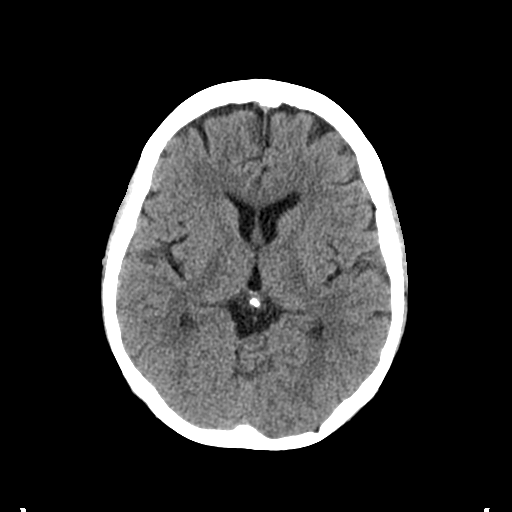
[im 15/29  brain]
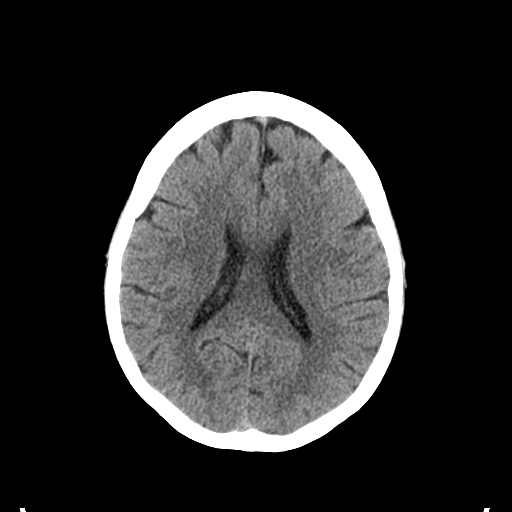
[im 15/29  bone]
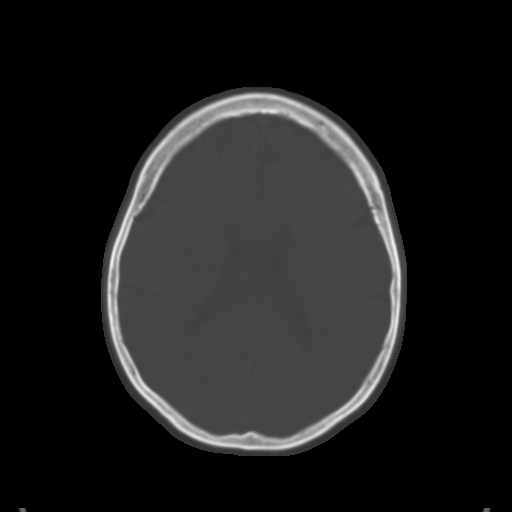
[im 18/29  brain]
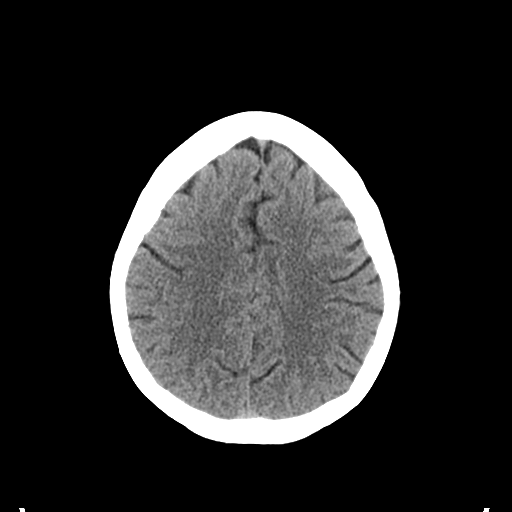
[im 21/29  brain]
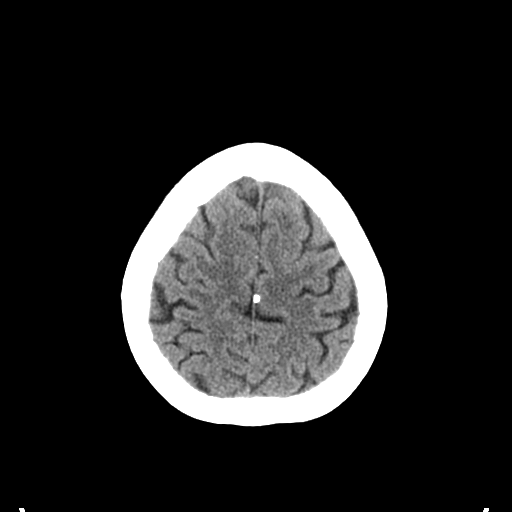
[im 24/29  brain]
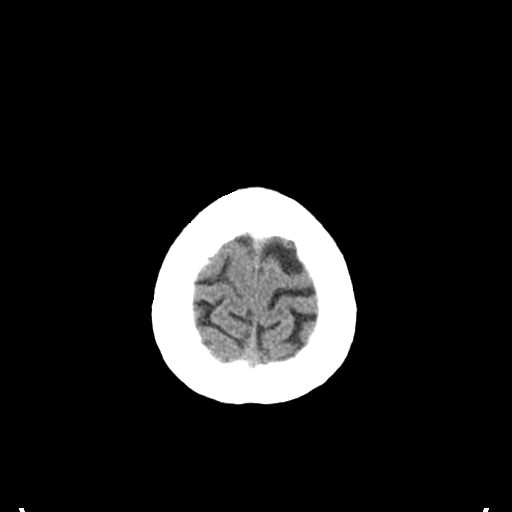
[im 27/29  brain]
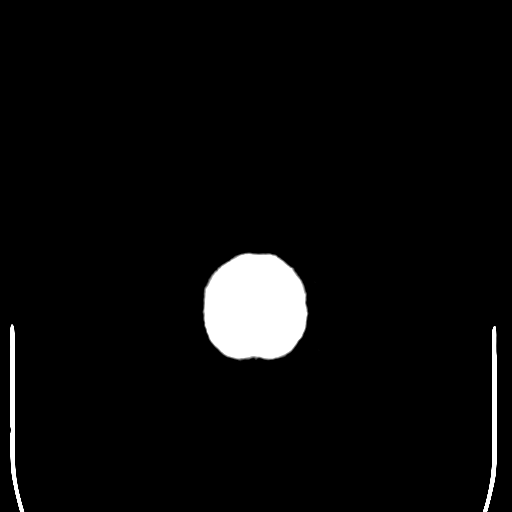
[im 27/29  bone]
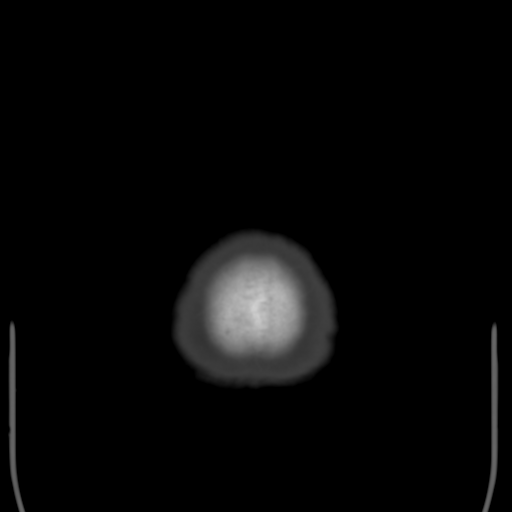

[Series 4: coronal soft tissue · coronal · 0.28mm/px · 3 of 62 slices shown]
[im 21/62  brain]
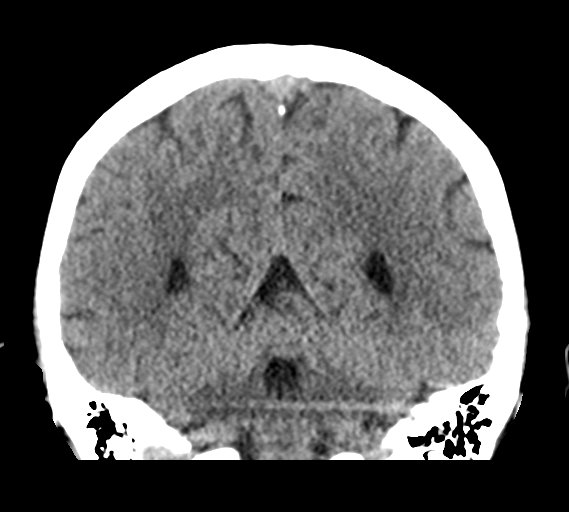
[im 28/62  brain]
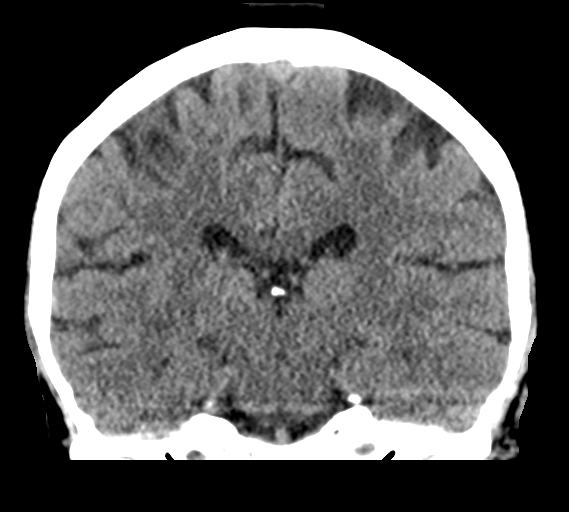
[im 34/62  brain]
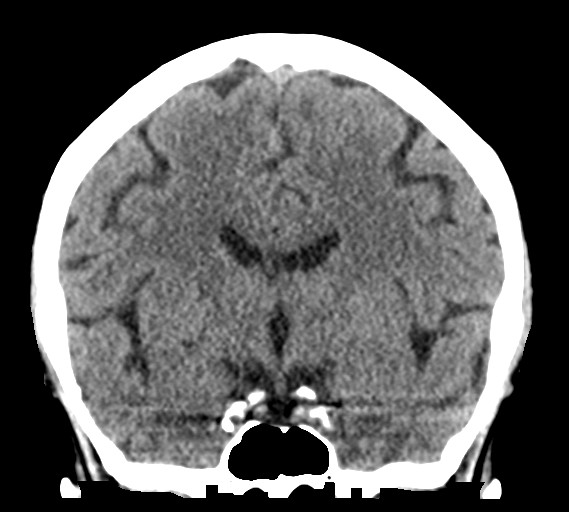

[Series 5: sagittal soft tissue · sagittal · 0.28mm/px · 3 of 54 slices shown]
[im 18/54  brain]
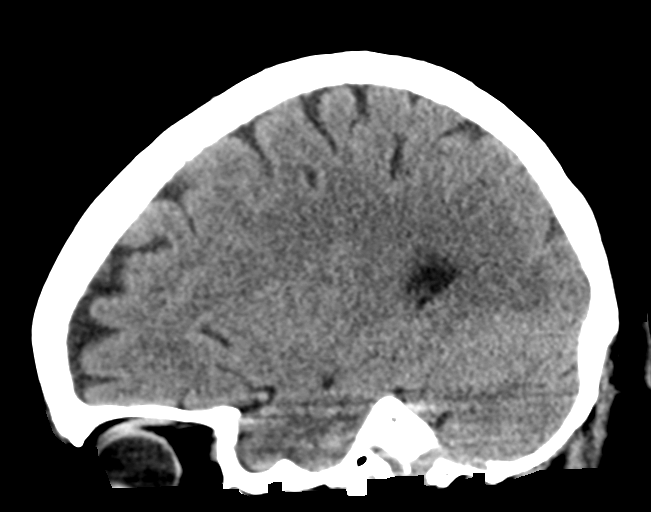
[im 27/54  brain]
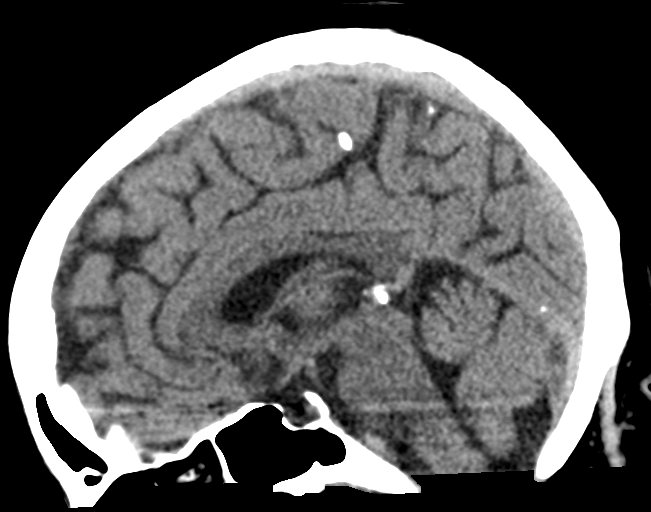
[im 36/54  brain]
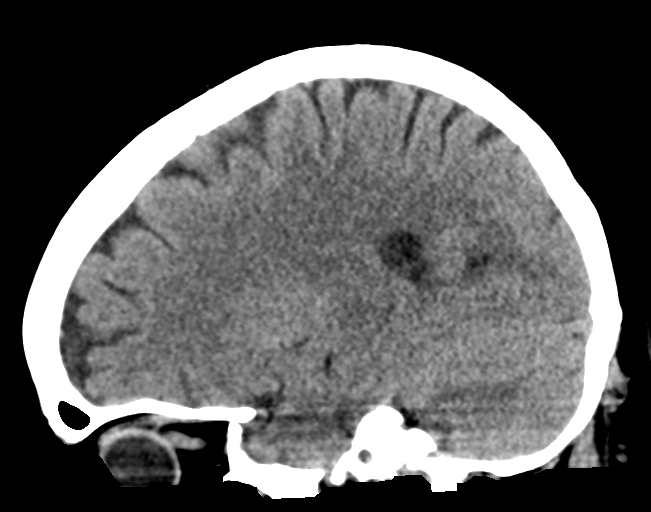

[15 of 46 positions shown; findings below may reference images not displayed]

FINDINGS: CT HEAD FINDINGS

Brain: No evidence of acute infarction, hemorrhage, hydrocephalus,
extra-axial collection or mass lesion/mass effect.

Mild subcortical white matter and periventricular small vessel
ischemic changes.

Vascular: Intracranial atherosclerosis.

Skull: Normal. Negative for fracture or focal lesion.

Sinuses/Orbits: The visualized paranasal sinuses are essentially
clear. The mastoid air cells are unopacified.

Other: None.

CT CERVICAL SPINE FINDINGS

Alignment: Normal cervical lordosis.

Skull base and vertebrae: No acute fracture. No primary bone lesion
or focal pathologic process.

Soft tissues and spinal canal: No prevertebral fluid or swelling. No
visible canal hematoma.

Disc levels: Vertebral body heights and intervertebral disc spaces
are maintained. Mild degenerative changes at C5-6. Spinal canal is
patent.

Upper chest: Visualized lung apices are clear.

Other: Visualized thyroid is unremarkable.
IMPRESSION: No evidence of acute intracranial abnormality. Mild small vessel
ischemic changes.

No evidence of traumatic injury to the cervical spine. Mild
degenerative changes.
